# Patient Record
Sex: Female | Born: 1950
Health system: Southern US, Community
[De-identification: ages and names within clinical notes are randomized; demographics above are authoritative.]

## PROBLEM LIST (undated history)

## (undated) DIAGNOSIS — I2699 Other pulmonary embolism without acute cor pulmonale: Secondary | ICD-10-CM

## (undated) DIAGNOSIS — R12 Heartburn: Secondary | ICD-10-CM

## (undated) DIAGNOSIS — I739 Peripheral vascular disease, unspecified: Secondary | ICD-10-CM

## (undated) DIAGNOSIS — R519 Headache, unspecified: Secondary | ICD-10-CM

## (undated) DIAGNOSIS — J300 Vasomotor rhinitis: Secondary | ICD-10-CM

## (undated) DIAGNOSIS — I10 Essential (primary) hypertension: Secondary | ICD-10-CM

## (undated) DIAGNOSIS — R011 Cardiac murmur, unspecified: Secondary | ICD-10-CM

## (undated) DIAGNOSIS — M1990 Unspecified osteoarthritis, unspecified site: Secondary | ICD-10-CM

## (undated) DIAGNOSIS — R51 Headache: Secondary | ICD-10-CM

## (undated) HISTORY — PX: ABDOMINAL HYSTERECTOMY: SHX81

## (undated) HISTORY — PX: JOINT REPLACEMENT: SHX530

---

## 1984-05-09 DIAGNOSIS — I2699 Other pulmonary embolism without acute cor pulmonale: Secondary | ICD-10-CM

## 1984-05-09 HISTORY — DX: Other pulmonary embolism without acute cor pulmonale: I26.99

## 2007-08-15 ENCOUNTER — Encounter: Admission: RE | Admit: 2007-08-15 | Discharge: 2007-08-15 | Payer: Self-pay | Admitting: Family Medicine

## 2008-08-15 ENCOUNTER — Encounter: Admission: RE | Admit: 2008-08-15 | Discharge: 2008-08-15 | Payer: Self-pay | Admitting: Family Medicine

## 2009-04-09 ENCOUNTER — Inpatient Hospital Stay (HOSPITAL_COMMUNITY): Admission: RE | Admit: 2009-04-09 | Discharge: 2009-04-12 | Payer: Self-pay | Admitting: Orthopedic Surgery

## 2009-05-27 ENCOUNTER — Ambulatory Visit (HOSPITAL_COMMUNITY): Admission: RE | Admit: 2009-05-27 | Discharge: 2009-05-27 | Payer: Self-pay | Admitting: Orthopedic Surgery

## 2009-08-18 ENCOUNTER — Encounter: Admission: RE | Admit: 2009-08-18 | Discharge: 2009-08-18 | Payer: Self-pay | Admitting: Family Medicine

## 2010-08-20 ENCOUNTER — Encounter
Admission: RE | Admit: 2010-08-20 | Discharge: 2010-08-20 | Payer: Self-pay | Source: Home / Self Care | Attending: Family Medicine | Admitting: Family Medicine

## 2010-08-30 ENCOUNTER — Encounter: Payer: Self-pay | Admitting: Family Medicine

## 2010-11-13 LAB — PROTIME-INR
INR: 3.8 — ABNORMAL HIGH (ref 0.00–1.49)
Prothrombin Time: 37.3 seconds — ABNORMAL HIGH (ref 11.6–15.2)
Prothrombin Time: 39.5 seconds — ABNORMAL HIGH (ref 11.6–15.2)

## 2010-11-13 LAB — BASIC METABOLIC PANEL
BUN: 4 mg/dL — ABNORMAL LOW (ref 6–23)
BUN: 4 mg/dL — ABNORMAL LOW (ref 6–23)
CO2: 27 mEq/L (ref 19–32)
Calcium: 8.4 mg/dL (ref 8.4–10.5)
Calcium: 8.5 mg/dL (ref 8.4–10.5)
Calcium: 8.8 mg/dL (ref 8.4–10.5)
Chloride: 102 mEq/L (ref 96–112)
Chloride: 105 mEq/L (ref 96–112)
Creatinine, Ser: 0.87 mg/dL (ref 0.4–1.2)
GFR calc Af Amer: >60 mL/min (ref >60)
GFR calc Af Amer: >60 mL/min (ref >60)
GFR calc non Af Amer: >60 mL/min (ref >60)
GFR calc non Af Amer: >60 mL/min (ref >60)
Glucose, Bld: 99 mg/dL (ref 70–99)
Potassium: 3.5 mEq/L (ref 3.5–5.1)
Sodium: 138 mEq/L (ref 135–145)

## 2010-11-13 LAB — CBC
HCT: 26.6 % — ABNORMAL LOW (ref 36.0–46.0)
HCT: 28.2 % — ABNORMAL LOW (ref 36.0–46.0)
Hemoglobin: 11.2 g/dL — ABNORMAL LOW (ref 12.0–15.0)
MCV: 93.2 fL (ref 78.0–100.0)
Platelets: 140 10*3/uL — ABNORMAL LOW (ref 150–400)
Platelets: 155 10*3/uL (ref 150–400)
RDW: 12.6 % (ref 11.5–15.5)
RDW: 12.7 % (ref 11.5–15.5)
WBC: 5.1 10*3/uL (ref 4.0–10.5)

## 2010-11-13 LAB — TYPE AND SCREEN: Antibody Screen: NEGATIVE

## 2010-11-13 LAB — ABO/RH: ABO/RH(D): A POS

## 2010-11-14 LAB — PROTIME-INR
INR: 1 (ref 0.00–1.49)
Prothrombin Time: 13.2 seconds (ref 11.6–15.2)

## 2010-11-14 LAB — COMPREHENSIVE METABOLIC PANEL
ALT: 17 U/L (ref 0–35)
AST: 27 U/L (ref 0–37)
Alkaline Phosphatase: 94 U/L (ref 39–117)
Chloride: 99 mEq/L (ref 96–112)
GFR calc non Af Amer: 54 mL/min — ABNORMAL LOW (ref >60)
Glucose, Bld: 103 mg/dL — ABNORMAL HIGH (ref 70–99)
Potassium: 4.2 mEq/L (ref 3.5–5.1)

## 2010-11-14 LAB — URINALYSIS, ROUTINE W REFLEX MICROSCOPIC
Hgb urine dipstick: NEGATIVE
Nitrite: NEGATIVE
Protein, ur: NEGATIVE mg/dL
Specific Gravity, Urine: 1.011 (ref 1.005–1.030)
Urobilinogen, UA: 0.2 mg/dL (ref 0.0–1.0)

## 2010-11-14 LAB — APTT: aPTT: 34 seconds (ref 24–37)

## 2010-11-14 LAB — CBC
MCHC: 33.8 g/dL (ref 30.0–36.0)
RBC: 4.44 MIL/uL (ref 3.87–5.11)
WBC: 4.8 10*3/uL (ref 4.0–10.5)

## 2011-07-22 ENCOUNTER — Ambulatory Visit (INDEPENDENT_AMBULATORY_CARE_PROVIDER_SITE_OTHER): Payer: BC Managed Care – PPO | Admitting: Sports Medicine

## 2011-07-22 VITALS — BP 130/80 | Ht 67.0 in | Wt 178.0 lb

## 2011-07-22 DIAGNOSIS — M79609 Pain in unspecified limb: Secondary | ICD-10-CM

## 2011-07-22 DIAGNOSIS — M79672 Pain in left foot: Secondary | ICD-10-CM

## 2011-07-22 DIAGNOSIS — M2170 Unequal limb length (acquired), unspecified site: Secondary | ICD-10-CM

## 2011-07-22 NOTE — Progress Notes (Signed)
  Subjective:    Patient ID: Jennifer Russell, female    DOB: 11-Jun-1951, 60 y.o.   MRN: [id number]  HPI Jennifer Russell is a 60 years old female patient, today complaining of left foot. She was seen by Dr. Eulah Pont 2 weeks ago and diagnosed with a left foot tarsal bone arthritis. He recommended her to schedule an appointment with [place] for custom orthotic. She stated that her left foot hurts in the dorsum of her foot, 3/10 in intensity, worse with weightbearing activities, improved by rest, no radiated, no numbness no tingling. She has a high arch. She she bought new drug issues about a week ago with a rigid plastic insert  Patient Active Problem List  Diagnoses  . Foot pain, left   No current outpatient prescriptions on file prior to visit.   Allergies no known allergies     Review of Systems  Constitutional: Negative for fever, chills, diaphoresis and fatigue.  Musculoskeletal: Negative for back pain, joint swelling and arthralgias.  Neurological: Negative for weakness and numbness.       Objective:   Physical Exam  Constitutional: She appears well-developed and well-nourished.       BP 130/80  Ht 5\' 7"  (1.702 m)  Wt 178 lb (80.74 kg)  BMI 27.88 kg/m2   Pulmonary/Chest: Effort normal.  Musculoskeletal:       Feet with cavus arch. Skin is intact. No swelling no hematoma. Left foot with TTP in the dorsum of the mid foot. Full range of motion for the ankle joint and old the 5 toes Neurovascularly intact Leg length discrepancy being the right leg shorter by 2 cm. Right foot with external rotation during gait  Neurological: She is alert.  Skin: Skin is warm. No rash noted. No erythema. No pallor.          Assessment & Plan:   1. Foot pain, left   2. Leg length discrepancy

## 2011-07-22 NOTE — Patient Instructions (Signed)
Continue using orthotics F/U in 4-6 weeks for custom orthotics

## 2011-07-30 ENCOUNTER — Other Ambulatory Visit (HOSPITAL_COMMUNITY): Payer: Self-pay

## 2011-08-11 ENCOUNTER — Inpatient Hospital Stay: Admit: 2011-08-11 | Payer: Self-pay | Admitting: Orthopedic Surgery

## 2011-08-11 SURGERY — ARTHROPLASTY, KNEE, TOTAL
Anesthesia: General | Laterality: Right

## 2011-08-23 ENCOUNTER — Ambulatory Visit (INDEPENDENT_AMBULATORY_CARE_PROVIDER_SITE_OTHER): Payer: BC Managed Care – PPO | Admitting: Sports Medicine

## 2011-08-23 ENCOUNTER — Other Ambulatory Visit: Payer: Self-pay | Admitting: *Deleted

## 2011-08-23 VITALS — BP 136/81 | HR 70

## 2011-08-23 DIAGNOSIS — M79672 Pain in left foot: Secondary | ICD-10-CM

## 2011-08-23 DIAGNOSIS — M7740 Metatarsalgia, unspecified foot: Secondary | ICD-10-CM | POA: Insufficient documentation

## 2011-08-23 DIAGNOSIS — M1710 Unilateral primary osteoarthritis, unspecified knee: Secondary | ICD-10-CM | POA: Insufficient documentation

## 2011-08-23 DIAGNOSIS — M79609 Pain in unspecified limb: Secondary | ICD-10-CM

## 2011-08-23 DIAGNOSIS — M7750 Other enthesopathy of unspecified foot: Secondary | ICD-10-CM

## 2011-08-23 NOTE — Assessment & Plan Note (Signed)
See if she gets some relief of knee pain with cushion of orthotics Cont other RX as per ortho

## 2011-08-23 NOTE — Assessment & Plan Note (Addendum)
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 The orthotic was heated and afterward the patient stood on the orthotic blank positioned on the orthotic stand. The patient was positioned in subtalar neutral position and 10 degrees of ankle dorsiflexion in a weight bearing stance. After completion of molding, a stable base was applied to the orthotic blank. The blank was ground to a stable position for weight bearing. Size: 8 red cambray Base:blue EVA Posting:RT with foam lift 1/2 cm Additional orthotic padding: MT pads Time 40 mins  Gait comfortable in orthotics and well balanced foot strike

## 2011-08-23 NOTE — Progress Notes (Signed)
  Subjective:    Patient ID: Jennifer Russell, female    DOB: March 16, 1951, 60 y.o.   MRN: [id number]  HPI  Pt presents to clinic for f/u of left foot pain which she states has improved with temporary orthotics. Pain was located over navicular both anterior and lateral to posterior tib tendon.  Has used sports insoles with scaphoid and MT pads bilat.   Has had left knee replacemnt Has sig medial jt DJD of RT knee  Insoles have decreased RT knee pain and she is delaying on replacement at present     Review of Systems     Objective:   Physical Exam  Cavus foot bilat Splaying of 2-3 toes with loss of transverse arch on lt Lt foot wider than rt Rt- early splaying of 2-3 toes, 1st MTP joint shows bilat hypertrophy with subluxation on lt  Lt leg 2 cm longer than rt Excellent great toe motion bilat 2nd MT head is down on lt 2nd MT head is starting to drop on rt   Good ankle motion bilat Walking gait- does not show significant trendelenburg  Lt side of back sits slightly higher than rt with slight mid thoracic curve Slight thoracic scoliosis        Assessment & Plan:

## 2011-08-23 NOTE — Assessment & Plan Note (Signed)
Cont MT pads in all walking shoes  Reck 3 to 4 mos

## 2011-08-25 ENCOUNTER — Other Ambulatory Visit: Payer: Self-pay | Admitting: Family Medicine

## 2011-08-25 DIAGNOSIS — Z1231 Encounter for screening mammogram for malignant neoplasm of breast: Secondary | ICD-10-CM

## 2011-09-08 ENCOUNTER — Ambulatory Visit
Admission: RE | Admit: 2011-09-08 | Discharge: 2011-09-08 | Disposition: A | Discharge status: Home / Self Care | Payer: BC Managed Care – PPO | Source: Ambulatory Visit | Attending: Family Medicine | Admitting: Family Medicine

## 2011-09-08 DIAGNOSIS — Z1231 Encounter for screening mammogram for malignant neoplasm of breast: Secondary | ICD-10-CM

## 2012-08-22 ENCOUNTER — Other Ambulatory Visit: Payer: Self-pay | Admitting: Family Medicine

## 2012-08-22 DIAGNOSIS — Z1231 Encounter for screening mammogram for malignant neoplasm of breast: Secondary | ICD-10-CM

## 2012-09-25 ENCOUNTER — Ambulatory Visit
Admission: RE | Admit: 2012-09-25 | Discharge: 2012-09-25 | Disposition: A | Discharge status: Home / Self Care | Payer: BC Managed Care – PPO | Source: Ambulatory Visit | Attending: Family Medicine | Admitting: Family Medicine

## 2012-09-25 DIAGNOSIS — Z1231 Encounter for screening mammogram for malignant neoplasm of breast: Secondary | ICD-10-CM

## 2013-01-11 ENCOUNTER — Other Ambulatory Visit: Payer: Self-pay | Admitting: Neurosurgery

## 2013-01-11 DIAGNOSIS — M4802 Spinal stenosis, cervical region: Secondary | ICD-10-CM

## 2013-01-17 ENCOUNTER — Ambulatory Visit
Admission: RE | Admit: 2013-01-17 | Discharge: 2013-01-17 | Disposition: A | Discharge status: Home / Self Care | Payer: BC Managed Care – PPO | Source: Ambulatory Visit | Attending: Neurosurgery | Admitting: Neurosurgery

## 2013-01-17 VITALS — BP 120/75 | HR 52

## 2013-01-17 DIAGNOSIS — M4802 Spinal stenosis, cervical region: Secondary | ICD-10-CM

## 2013-01-17 MED ORDER — MORPHINE SULFATE 4 MG/ML IJ SOLN
5.0000 mg | Freq: Once | INTRAMUSCULAR | Status: AC
  Administered 2013-01-17: 5 mg via INTRAMUSCULAR

## 2013-01-17 MED ORDER — DIAZEPAM 5 MG PO TABS
10.0000 mg | ORAL_TABLET | Freq: Once | ORAL | Status: AC
  Administered 2013-01-17: 10 mg via ORAL

## 2013-01-17 MED ORDER — ONDANSETRON HCL 4 MG/2ML IJ SOLN
4.0000 mg | Freq: Once | INTRAMUSCULAR | Status: AC
  Administered 2013-01-17: 4 mg via INTRAMUSCULAR

## 2013-01-17 MED ORDER — IOHEXOL 300 MG/ML  SOLN
10.0000 mL | Freq: Once | INTRAMUSCULAR | Status: AC | PRN
  Administered 2013-01-17: 10 mL via INTRATHECAL

## 2013-01-17 NOTE — Progress Notes (Signed)
Pt states she has been off her cymbalta for the past 2 days. Discharge instructions explained.

## 2013-09-05 ENCOUNTER — Other Ambulatory Visit: Payer: Self-pay

## 2013-09-05 DIAGNOSIS — Z1231 Encounter for screening mammogram for malignant neoplasm of breast: Secondary | ICD-10-CM

## 2013-09-26 ENCOUNTER — Ambulatory Visit
Admission: RE | Admit: 2013-09-26 | Discharge: 2013-09-26 | Disposition: A | Discharge status: Home / Self Care | Payer: BC Managed Care – PPO | Source: Ambulatory Visit

## 2013-09-26 DIAGNOSIS — Z1231 Encounter for screening mammogram for malignant neoplasm of breast: Secondary | ICD-10-CM

## 2014-08-28 ENCOUNTER — Other Ambulatory Visit: Payer: Self-pay

## 2014-08-28 DIAGNOSIS — Z1231 Encounter for screening mammogram for malignant neoplasm of breast: Secondary | ICD-10-CM

## 2014-10-02 ENCOUNTER — Ambulatory Visit: Payer: BC Managed Care – PPO

## 2014-10-10 ENCOUNTER — Ambulatory Visit
Admission: RE | Admit: 2014-10-10 | Discharge: 2014-10-10 | Disposition: A | Discharge status: Home / Self Care | Payer: BLUE CROSS/BLUE SHIELD | Source: Ambulatory Visit

## 2014-10-10 DIAGNOSIS — Z1231 Encounter for screening mammogram for malignant neoplasm of breast: Secondary | ICD-10-CM

## 2015-09-10 ENCOUNTER — Other Ambulatory Visit: Payer: Self-pay

## 2015-09-10 DIAGNOSIS — Z1231 Encounter for screening mammogram for malignant neoplasm of breast: Secondary | ICD-10-CM

## 2015-10-13 ENCOUNTER — Ambulatory Visit
Admission: RE | Admit: 2015-10-13 | Discharge: 2015-10-13 | Disposition: A | Discharge status: Home / Self Care | Payer: BLUE CROSS/BLUE SHIELD | Source: Ambulatory Visit

## 2015-10-13 DIAGNOSIS — Z1231 Encounter for screening mammogram for malignant neoplasm of breast: Secondary | ICD-10-CM

## 2016-05-11 ENCOUNTER — Encounter: Payer: Self-pay | Admitting: Family Medicine

## 2016-05-11 ENCOUNTER — Ambulatory Visit (INDEPENDENT_AMBULATORY_CARE_PROVIDER_SITE_OTHER): Payer: BLUE CROSS/BLUE SHIELD | Admitting: Family Medicine

## 2016-05-11 DIAGNOSIS — M79672 Pain in left foot: Secondary | ICD-10-CM

## 2016-05-11 NOTE — Progress Notes (Signed)
  Genella MechMeg Korson - 65 y.o. female MRN [id number]  Date of birth: 1950/11/21  SUBJECTIVE:  Including CC & ROS.  Chief Complaint  Patient presents with  . Foot Pain     Ms.Harriette BouillonStarnes is a 65 year old female that is presenting with left foot pain. This pain is acute on chronic in nature. The pain is occurring on the dorsal aspect of her left foot. She has [place] [facility] with some improvement. She has a history of orthotics that were made here and are about 63 years old. She has a history of midfoot arthritis. She denies any recent injury. Her foot does tend to swell from time to time.  ROS: No unexpected weight loss, fever, chills, instability, muscle pain, numbness/tingling, redness, otherwise see HPI   HISTORY: Past Medical, Surgical, Social, and Family History Reviewed & Updated per EMR.   Pertinent Historical Findings include: PMSHx -  History of PE, left knee TKA, plantar fascial release, leg length discrepancy PSHx -  No tobacco or alcohol use  FHx -  No osteoporosis  Medications - atenolol   DATA REVIEWED: [facility] been [facility] but she does not have them today.  PHYSICAL EXAM:  VS: BP:135/81  HR:62bpm  TEMP: ( )  RESP:   HT:5\' 7"  (170.2 cm)   WT:173 lb (78.5 kg)  BMI:27.2 PHYSICAL EXAM: Gen: NAD, alert, cooperative with exam, well-appearing HEENT: clear conjunctiva, EOMI CV:  capillary refill brisk,  Resp: non-labored, normal speech Skin: no rashes, normal turgor  Neuro: no gross deficits.  Psych:  alert and oriented Left Foot:  Some tarsal metatarsal bossing of the dorsal midfoot. Tenderness to palpation over the tarsometatarsal joints. Mild effusion and the midfoot area. Normal ankle range of motion. First MTP joint with some hypertrophy. Cavus foot bilaterally   ASSESSMENT & PLAN:   Foot pain, left She has a history of midfoot DJD and most likely her pain is associated with this. - Right midfoot strap -  Encouraged to follow-up for new orthotics. She has a history of leg length discrepancies orthotic may need additional padding - Continue to ice and take anti-inflammatories as needed.

## 2016-05-11 NOTE — Assessment & Plan Note (Signed)
She has a history of midfoot DJD and most likely her pain is associated with this. - Right midfoot strap - Encouraged to follow-up for new orthotics. She has a history of leg length discrepancies orthotic may need additional padding - Continue to ice and take anti-inflammatories as needed.

## 2016-05-18 ENCOUNTER — Encounter: Payer: BLUE CROSS/BLUE SHIELD | Admitting: Family Medicine

## 2016-07-14 NOTE — Progress Notes (Signed)
Scheduling pre op--please place SURGICAL ORDERS IN EPIC  thanks 

## 2016-07-15 ENCOUNTER — Ambulatory Visit: Payer: Self-pay | Admitting: Orthopedic Surgery

## 2016-07-15 NOTE — Progress Notes (Signed)
Please place SURGICAL ORDERS IN EPIC--HAS PRE OP SCHEDULED  thanks

## 2016-07-15 NOTE — H&P (Signed)
Jennifer ItoMeg O Busey DOB: 1951/03/12 Married / Language: English / Race: White Female Date of Admission:  07/26/2016 CC:  Right Knee Pain History of Present Illness The patient is a 65 year old female who comes in for a preoperative History and Physical. The patient is scheduled for a right total knee arthroplasty to be performed by Dr. Gus RankinFrank V. Aluisio, [facility] on 07-26-2016. The patient is a 65 year old female who presented for follow up of their knee. The patient is being followed for their right knee pain and osteoarthritis. They are now month(s) out from cortisone injection (that did provide some relief). Symptoms reported include: pain (mainly with weightbearing) and stiffness. The following medication has been used for pain control: none. The knee is unfortunately getting progressively worse over time. She is not getting swelling. It is not giving out. She does have significant pain medially. It is limiting what she can and cannot do. She wants to be more active, but cannot do so because of the knee. It does hurt at night at times. She is at a stage where she feels like she needs to get something done because of the significant pain and dysfunction in the knee. Radiographs show bone on bone arthritis in the medial and patellofemoral compartments of that right knee. She has significant pain and dysfunction. At this point, the most predictable means of improving her pain and function would be total knee arthroplasty. She has had the other side done so she is familiar with the this. Discussed with her the differences now compared to when she had the other side done. We discussed those in detail. She would like to proceed with right total knee arthroplasty.  They have been treated conservatively in the past for the above stated problem and despite conservative measures, they continue to have progressive pain and severe functional limitations and dysfunction. They have failed  non-operative management including home exercise, medications, and injections. It is felt that they would benefit from undergoing total joint replacement. Risks and benefits of the procedure have been discussed with the patient and they elect to proceed with surgery. There are no active contraindications to surgery such as ongoing infection or rapidly progressive neurological disease.  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 sister Cancer  First Degree Relatives. brother  Social History  Tobacco use  Never smoker. never smoker Tobacco / smoke exposure  no Pain Contract  yes Illicit drug use  no Exercise  Exercises daily; does other and gym / weights Drug/Alcohol Rehab (Previously)  no Number of flights of stairs before winded  greater than 5 Marital status  married Living situation  live with spouse Alcohol use  current drinker; drinks wine; 5-7 per week Drug/Alcohol Rehab (Currently)  no Children  2  Medication  History  Relpax (20MG  Tablet, Oral) Active. (prn migranes) Vitamin D (1 Oral) Specific strength unknown - Active. (qd) Atenolol (100MG  Tablet, Oral) Active. (qd)  Past  Surgical History Dilation and Curettage of Uterus - Multiple  Arthroscopy of Knee  left Hysterectomy  partial (non-cancerous) Foot Surgery  bilateral Tubal Ligation  Total Knee Replacement  left Sinus Surgery    Review of Systems General Not Present- Chills, Fatigue, Fever, Memory Loss, Night Sweats, Weight Gain and Weight Loss. Skin Not Present- Eczema, Hives, Itching, Lesions and Rash. HEENT Not Present- Dentures, Double Vision, Headache, Hearing Loss, Tinnitus and Visual Loss. Respiratory Not Present- Allergies, Chronic Cough, Coughing up blood, Shortness of breath at rest and Shortness of breath with exertion. Cardiovascular Not Present- Chest Pain, Difficulty Breathing Lying Down, Murmur, Palpitations, Racing/skipping heartbeats and Swelling. Gastrointestinal Not Present- Abdominal Pain, Bloody Stool, Constipation, Diarrhea, Difficulty Swallowing, Heartburn, Jaundice, Loss of appetitie, Nausea and Vomiting. Female Genitourinary Not Present- Blood in Urine, Discharge, Flank Pain, Incontinence, Painful Urination, Urgency, Urinary frequency, Urinary Retention, Urinating at Night and Weak urinary stream. Musculoskeletal Present- Joint Swelling. Not Present- Back Pain, Joint Pain, Morning Stiffness, Muscle Pain, Muscle Weakness and Spasms. Neurological Not Present- Blackout spells, Difficulty with balance, Dizziness, Paralysis, Tremor and Weakness. Psychiatric Not Present- Insomnia.  Vitals  Weight: 174 lb Height: 66.5in Weight was reported by patient. Height was reported by patient. Body Surface Area: 1.9 m Body Mass Index: 27.66 kg/m  Pulse: 62 (Regular)  BP: 142/82 (Sitting, Right Arm, Standard)   Physical Exam General Mental Status -Alert, cooperative and good historian. General Appearance-pleasant, Not in acute distress. Orientation-Oriented X3. Build & Nutrition-Well nourished and Well developed.  Head and Neck Head-normocephalic, atraumatic  . Neck Global Assessment - supple, no bruit auscultated on the right, no bruit auscultated on the left.  Eye Vision-Wears corrective lenses. Pupil - Bilateral-Regular and Round. Motion - Bilateral-EOMI.  Chest and Lung Exam Auscultation Breath sounds - clear at anterior chest wall and clear at posterior chest wall. Adventitious sounds - No Adventitious sounds.  Cardiovascular Auscultation Rhythm - Regular rate and rhythm. Heart Sounds - S1 WNL and S2 WNL. Murmurs & Other Heart Sounds - Auscultation of the heart reveals - No Murmurs.  Abdomen Palpation/Percussion Tenderness - Abdomen is non-tender to palpation. Rigidity (guarding) - Abdomen is soft. Auscultation Auscultation of the abdomen reveals - Bowel sounds normal.  Female Genitourinary Note: Not done, not pertinent to present illness   Musculoskeletal Note: A well-developed female, alert, oriented, in no apparent distress. Evaluation of her right knee shows no effusion. Range about 5 to 130. There is moderate crepitus on range of motion with some tenderness medial greater than lateral. No instability noted. Pulse, sensation, motor intact. She has got significant antalgic gait pattern.  RADIOGRAPHS Do show bone on bone arthritis in the medial and patellofemoral compartments of that right knee.  Assessment & Plan Primary osteoarthritis of right knee (M17.11)  Note:Surgical Plans: Right Total Knee Replacement  Disposition: Home  PCP: Dr. Laurann Montanaynthia White  Topical TXA - History of Pulmonary Embolism  Anesthesia Issues: None  Signed electronically by Beckey RutterAlezandrew L Evamaria Detore, III PA-C

## 2016-07-19 NOTE — Patient Instructions (Addendum)
Jennifer Russell  07/19/2016   Your procedure is scheduled on: 07/26/16  [facility]  [facility] to 3rd floor to  Short Stay Center at 0500 AM.  Call this number if you have problems the morning of surgery [phone]   Remember: ONLY 1 PERSON MAY GO WITH YOU TO SHORT STAY TO GET  READY MORNING OF YOUR SURGERY.  Do not eat food or drink liquids :After Midnight.     Take these medicines the morning of surgery with A SIP OF WATER:ATENOLOL          Use Atrovent and Flonase May take Valium if needed                                 You may not have any metal on your body including hair pins and              piercings  Do not wear jewelry, make-up, lotions, powders or perfumes, deodorant             Do not wear nail polish.  Do not shave  48 hours prior to surgery.              Men may shave face and neck.   Do not bring valuables to the hospital. [facility] IS NOT             RESPONSIBLE   FOR VALUABLES.  Contacts, dentures or bridgework may not be worn into surgery.  Leave suitcase in the car. After surgery it may be brought to your room.                Please read over the following fact sheets you were given: _____________________________________________________________________             [facility] - Preparing for Surgery Before surgery, you can play an important role.  Because skin is not sterile, your skin needs to be as free of germs as possible.  You can reduce the number of germs on your skin by washing with CHG (chlorahexidine gluconate) soap before surgery.  CHG is an antiseptic cleaner which kills germs and bonds with the skin to continue killing germs even after washing. Please DO NOT use if you have an allergy to CHG or antibacterial soaps.  If your skin becomes reddened/irritated stop using the CHG and inform your nurse when you arrive at Short Stay. Do not shave (including legs and underarms) for at least 48  hours prior to the first CHG shower.  You may shave your face/neck. Please follow these instructions carefully:  1.  Shower with CHG Soap the night before surgery and the  morning of Surgery.  2.  If you choose to wash your hair, wash your hair first as usual with your  normal  shampoo.  3.  After you shampoo, rinse your hair and body thoroughly to remove the  shampoo.                           4.  Use CHG as you would any other liquid soap.  You can apply chg directly  to the skin and wash  Gently with a scrungie or clean washcloth.  5.  Apply the CHG Soap to your body ONLY FROM THE NECK DOWN.   Do not use on face/ open                           Wound or open sores. Avoid contact with eyes, ears mouth and genitals (private parts).                       Wash face,  Genitals (private parts) with your normal soap.             6.  Wash thoroughly, paying special attention to the area where your surgery  will be performed.  7.  Thoroughly rinse your body with warm water from the neck down.  8.  DO NOT shower/wash with your normal soap after using and rinsing off  the CHG Soap.                9.  Pat yourself dry with a clean towel.            10.  Wear clean pajamas.            11.  Place clean sheets on your bed the night of your first shower and do not  sleep with pets. Day of Surgery : Do not apply any lotions/deodorants the morning of surgery.  Please wear clean clothes to the hospital/surgery center.  FAILURE TO FOLLOW THESE INSTRUCTIONS MAY RESULT IN THE CANCELLATION OF YOUR SURGERY PATIENT SIGNATURE_________________________________  NURSE SIGNATURE__________________________________  ________________________________________________________________________   Adam Phenix  An incentive spirometer is a tool that can help keep your lungs clear and active. This tool measures how well you are filling your lungs with each breath. Taking long deep breaths may help  reverse or decrease the chance of developing breathing (pulmonary) problems (especially infection) following:  A long period of time when you are unable to move or be active. BEFORE THE PROCEDURE   If the spirometer includes an indicator to show your best effort, your nurse or respiratory therapist will set it to a desired goal.  If possible, sit up straight or lean slightly forward. Try not to slouch.  Hold the incentive spirometer in an upright position. INSTRUCTIONS FOR USE  1. Sit on the edge of your bed if possible, or sit up as far as you can in bed or on a chair. 2. Hold the incentive spirometer in an upright position. 3. Breathe out normally. 4. Place the mouthpiece in your mouth and seal your lips tightly around it. 5. Breathe in slowly and as deeply as possible, raising the piston or the ball toward the top of the column. 6. Hold your breath for 3-5 seconds or for as long as possible. Allow the piston or ball to fall to the bottom of the column. 7. Remove the mouthpiece from your mouth and breathe out normally. 8. Rest for a few seconds and repeat Steps 1 through 7 at least 10 times every 1-2 hours when you are awake. Take your time and take a few normal breaths between deep breaths. 9. The spirometer may include an indicator to show your best effort. Use the indicator as a goal to work toward during each repetition. 10. After each set of 10 deep breaths, practice coughing to be sure your lungs are clear. If you have an incision (the cut made at the time of surgery),  support your incision when coughing by placing a pillow or rolled up towels firmly against it. Once you are able to get out of bed, walk around indoors and cough well. You may stop using the incentive spirometer when instructed by your caregiver.  RISKS AND COMPLICATIONS  Take your time so you do not get dizzy or light-headed.  If you are in pain, you may need to take or ask for pain medication before doing incentive  spirometry. It is harder to take a deep breath if you are having pain. AFTER USE  Rest and breathe slowly and easily.  It can be helpful to keep track of a log of your progress. Your caregiver can provide you with a simple table to help with this. If you are using the spirometer at home, follow these instructions: [place] IF:   You are having difficultly using the spirometer.  You have trouble using the spirometer as often as instructed.  Your pain medication is not giving enough relief while using the spirometer.  You develop fever of 100.5 F (38.1 C) or higher. SEEK IMMEDIATE MEDICAL CARE IF:   You cough up bloody sputum that had not been present before.  You develop fever of 102 F (38.9 C) or greater.  You develop worsening pain at or near the incision site. MAKE SURE YOU:   Understand these instructions.  Will watch your condition.  Will get help right away if you are not doing well or get worse. Document Released: 12/06/2006 Document Revised: 10/18/2011 Document Reviewed: 02/06/2007 ExitCare Patient Information 2014 Exit[employer], [place].   ________________________________________________________________________  WHAT IS A BLOOD TRANSFUSION? Blood Transfusion Information  A transfusion is the replacement of blood or some of its parts. Blood is made up of multiple cells which provide different functions.  Red blood cells carry oxygen and are used for blood loss replacement.  White blood cells fight against infection.  Platelets control bleeding.  Plasma helps clot blood.  Other blood products are available for specialized needs, such as hemophilia or other clotting disorders. BEFORE THE TRANSFUSION  Who gives blood for transfusions?   Healthy volunteers who are fully evaluated to make sure their blood is safe. This is blood bank blood. Transfusion therapy is the safest it has ever been in the practice of medicine. Before blood is taken from a donor, a  complete history is taken to make sure that person has no history of diseases nor engages in risky social behavior (examples are intravenous drug use or sexual activity with multiple partners). The donor's travel history is screened to minimize risk of transmitting infections, such as malaria. The donated blood is tested for signs of infectious diseases, such as HIV and hepatitis. The blood is then tested to be sure it is compatible with you in order to minimize the chance of a transfusion reaction. If you or a relative donates blood, this is often done in anticipation of surgery and is not appropriate for emergency situations. It takes many days to process the donated blood. RISKS AND COMPLICATIONS Although transfusion therapy is very safe and saves many lives, the main dangers of transfusion include:   Getting an infectious disease.  Developing a transfusion reaction. This is an allergic reaction to something in the blood you were given. Every precaution is taken to prevent this. The decision to have a blood transfusion has been considered carefully by your caregiver before blood is given. Blood is not given unless the benefits outweigh the risks. AFTER THE TRANSFUSION  Right after receiving a blood transfusion, you will usually feel much better and more energetic. This is especially true if your red blood cells have gotten low (anemic). The transfusion raises the level of the red blood cells which carry oxygen, and this usually causes an energy increase.  The nurse administering the transfusion will monitor you carefully for complications. HOME CARE INSTRUCTIONS  No special instructions are needed after a transfusion. You may find your energy is better. Speak with your caregiver about any limitations on activity for underlying diseases you may have. SEEK MEDICAL CARE IF:   Your condition is not improving after your transfusion.  You develop redness or irritation at the intravenous (IV)  site. SEEK IMMEDIATE MEDICAL CARE IF:  Any of the following symptoms occur over the next 12 hours:  Shaking chills.  You have a temperature by mouth above 102 F (38.9 C), not controlled by medicine.  Chest, back, or muscle pain.  People around you feel you are not acting correctly or are confused.  Shortness of breath or difficulty breathing.  Dizziness and fainting.  You get a rash or develop hives.  You have a decrease in urine output.  Your urine turns a dark color or changes to pink, red, or brown. Any of the following symptoms occur over the next 10 days:  You have a temperature by mouth above 102 F (38.9 C), not controlled by medicine.  Shortness of breath.  Weakness after normal activity.  The white part of the eye turns yellow (jaundice).  You have a decrease in the amount of urine or are urinating less often.  Your urine turns a dark color or changes to pink, red, or brown. Document Released: 07/23/2000 Document Revised: 10/18/2011 Document Reviewed: 03/11/2008 [facility] Patient Information 2014 [place], [place].  _______________________________________________________________________

## 2016-07-20 ENCOUNTER — Encounter (HOSPITAL_COMMUNITY)
Admission: RE | Admit: 2016-07-20 | Discharge: 2016-07-20 | Disposition: A | Discharge status: Home / Self Care | Payer: BLUE CROSS/BLUE SHIELD | Source: Ambulatory Visit | Attending: Orthopedic Surgery | Admitting: Orthopedic Surgery

## 2016-07-20 ENCOUNTER — Encounter (HOSPITAL_COMMUNITY): Payer: Self-pay

## 2016-07-20 ENCOUNTER — Encounter (INDEPENDENT_AMBULATORY_CARE_PROVIDER_SITE_OTHER): Payer: Self-pay

## 2016-07-20 DIAGNOSIS — Z01812 Encounter for preprocedural laboratory examination: Secondary | ICD-10-CM | POA: Insufficient documentation

## 2016-07-20 DIAGNOSIS — M1711 Unilateral primary osteoarthritis, right knee: Secondary | ICD-10-CM | POA: Insufficient documentation

## 2016-07-20 HISTORY — DX: Unspecified osteoarthritis, unspecified site: M19.90

## 2016-07-20 HISTORY — DX: Other pulmonary embolism without acute cor pulmonale: I26.99

## 2016-07-20 HISTORY — DX: Vasomotor rhinitis: J30.0

## 2016-07-20 HISTORY — DX: Heartburn: R12

## 2016-07-20 HISTORY — DX: Headache, unspecified: R51.9

## 2016-07-20 HISTORY — DX: Essential (primary) hypertension: I10

## 2016-07-20 HISTORY — DX: Peripheral vascular disease, unspecified: I73.9

## 2016-07-20 HISTORY — DX: Cardiac murmur, unspecified: R01.1

## 2016-07-20 HISTORY — DX: Headache: R51

## 2016-07-20 LAB — COMPREHENSIVE METABOLIC PANEL
ALBUMIN: 4.4 g/dL (ref 3.5–5.0)
ALK PHOS: 69 U/L (ref 38–126)
ALT: 29 U/L (ref 14–54)
ANION GAP: 7 (ref 5–15)
AST: 26 U/L (ref 15–41)
BILIRUBIN TOTAL: 1.2 mg/dL (ref 0.3–1.2)
BUN: 25 mg/dL — ABNORMAL HIGH (ref 6–20)
CO2: 26 mmol/L (ref 22–32)
Calcium: 9.2 mg/dL (ref 8.9–10.3)
Chloride: 106 mmol/L (ref 101–111)
Creatinine, Ser: 0.87 mg/dL (ref 0.44–1.00)
GLUCOSE: 92 mg/dL (ref 65–99)
Potassium: 4.2 mmol/L (ref 3.5–5.1)
Sodium: 139 mmol/L (ref 135–145)
TOTAL PROTEIN: 6.7 g/dL (ref 6.5–8.1)

## 2016-07-20 LAB — URINALYSIS, ROUTINE W REFLEX MICROSCOPIC
Bilirubin Urine: NEGATIVE
Glucose, UA: NEGATIVE mg/dL
Hgb urine dipstick: NEGATIVE
KETONES UR: NEGATIVE mg/dL
LEUKOCYTES UA: NEGATIVE
NITRITE: NEGATIVE
PH: 6 (ref 5.0–8.0)
PROTEIN: NEGATIVE mg/dL
Specific Gravity, Urine: 1.015 (ref 1.005–1.030)

## 2016-07-20 LAB — ABO/RH: ABO/RH(D): A POS

## 2016-07-20 LAB — CBC
HCT: 43.3 % (ref 36.0–46.0)
HEMOGLOBIN: 14.4 g/dL (ref 12.0–15.0)
MCH: 30.8 pg (ref 26.0–34.0)
MCHC: 33.3 g/dL (ref 30.0–36.0)
MCV: 92.5 fL (ref 78.0–100.0)
Platelets: 180 10*3/uL (ref 150–400)
RBC: 4.68 MIL/uL (ref 3.87–5.11)
RDW: 13.2 % (ref 11.5–15.5)
WBC: 5.1 10*3/uL (ref 4.0–10.5)

## 2016-07-20 LAB — SURGICAL PCR SCREEN
MRSA, PCR: NEGATIVE
Staphylococcus aureus: NEGATIVE

## 2016-07-20 LAB — PROTIME-INR
INR: 1.01
PROTHROMBIN TIME: 13.3 s (ref 11.4–15.2)

## 2016-07-20 LAB — APTT: aPTT: 27 seconds (ref 24–36)

## 2016-07-20 NOTE — Progress Notes (Signed)
Clearance note  With LOV Dr Cliffton AstersWhite and ekg  07/16/16 on chart

## 2016-07-26 ENCOUNTER — Encounter (HOSPITAL_COMMUNITY): Payer: Self-pay | Admitting: *Deleted

## 2016-07-26 ENCOUNTER — Inpatient Hospital Stay (HOSPITAL_COMMUNITY)
Admission: RE | Admit: 2016-07-26 | Discharge: 2016-07-28 | DRG: 470 | Disposition: A | Discharge status: Home / Self Care | Payer: BLUE CROSS/BLUE SHIELD | Source: Ambulatory Visit | Attending: Orthopedic Surgery | Admitting: Orthopedic Surgery

## 2016-07-26 ENCOUNTER — Encounter (HOSPITAL_COMMUNITY)
Admission: RE | Disposition: A | Discharge status: Home / Self Care | Payer: Self-pay | Source: Ambulatory Visit | Attending: Orthopedic Surgery

## 2016-07-26 ENCOUNTER — Inpatient Hospital Stay (HOSPITAL_COMMUNITY): Payer: BLUE CROSS/BLUE SHIELD | Admitting: Anesthesiology

## 2016-07-26 DIAGNOSIS — Z96652 Presence of left artificial knee joint: Secondary | ICD-10-CM | POA: Diagnosis present

## 2016-07-26 DIAGNOSIS — Z90711 Acquired absence of uterus with remaining cervical stump: Secondary | ICD-10-CM

## 2016-07-26 DIAGNOSIS — Z809 Family history of malignant neoplasm, unspecified: Secondary | ICD-10-CM | POA: Diagnosis not present

## 2016-07-26 DIAGNOSIS — Z8249 Family history of ischemic heart disease and other diseases of the circulatory system: Secondary | ICD-10-CM | POA: Diagnosis not present

## 2016-07-26 DIAGNOSIS — M1711 Unilateral primary osteoarthritis, right knee: Principal | ICD-10-CM | POA: Diagnosis present

## 2016-07-26 DIAGNOSIS — M1710 Unilateral primary osteoarthritis, unspecified knee: Secondary | ICD-10-CM

## 2016-07-26 DIAGNOSIS — Z9851 Tubal ligation status: Secondary | ICD-10-CM

## 2016-07-26 DIAGNOSIS — Z86711 Personal history of pulmonary embolism: Secondary | ICD-10-CM | POA: Diagnosis not present

## 2016-07-26 DIAGNOSIS — I739 Peripheral vascular disease, unspecified: Secondary | ICD-10-CM | POA: Diagnosis present

## 2016-07-26 DIAGNOSIS — I10 Essential (primary) hypertension: Secondary | ICD-10-CM | POA: Diagnosis present

## 2016-07-26 DIAGNOSIS — M179 Osteoarthritis of knee, unspecified: Secondary | ICD-10-CM | POA: Diagnosis present

## 2016-07-26 HISTORY — PX: TOTAL KNEE ARTHROPLASTY: SHX125

## 2016-07-26 LAB — TYPE AND SCREEN
ABO/RH(D): A POS
Antibody Screen: NEGATIVE

## 2016-07-26 SURGERY — ARTHROPLASTY, KNEE, TOTAL
Anesthesia: Spinal | Site: Knee | Laterality: Right

## 2016-07-26 MED ORDER — FLEET ENEMA 7-19 GM/118ML RE ENEM: 1.0000 | ENEMA | Freq: Once | RECTAL | Status: DC | PRN

## 2016-07-26 MED ORDER — CEFAZOLIN SODIUM-DEXTROSE 2-4 GM/100ML-% IV SOLN
INTRAVENOUS | Status: AC
  Filled 2016-07-26: qty 100

## 2016-07-26 MED ORDER — ZOLPIDEM TARTRATE 5 MG PO TABS
5.0000 mg | ORAL_TABLET | Freq: Every evening | ORAL | Status: DC | PRN
  Administered 2016-07-26 – 2016-07-28 (×2): 5 mg via ORAL
  Filled 2016-07-26 (×2): qty 1

## 2016-07-26 MED ORDER — EPHEDRINE SULFATE 50 MG/ML IJ SOLN
INTRAMUSCULAR | Status: DC | PRN
  Administered 2016-07-26 (×2): 5 mg via INTRAVENOUS

## 2016-07-26 MED ORDER — HYDROMORPHONE HCL 2 MG/ML IJ SOLN
INTRAMUSCULAR | Status: AC
  Filled 2016-07-26: qty 1

## 2016-07-26 MED ORDER — DIAZEPAM 5 MG PO TABS
5.0000 mg | ORAL_TABLET | Freq: Every day | ORAL | Status: DC | PRN
  Administered 2016-07-27: 5 mg via ORAL
  Filled 2016-07-26: qty 1

## 2016-07-26 MED ORDER — BUPIVACAINE LIPOSOME 1.3 % IJ SUSP
INTRAMUSCULAR | Status: DC | PRN
  Administered 2016-07-26: 20 mL

## 2016-07-26 MED ORDER — MIDAZOLAM HCL 2 MG/2ML IJ SOLN
INTRAMUSCULAR | Status: AC
  Filled 2016-07-26: qty 2

## 2016-07-26 MED ORDER — FENTANYL CITRATE (PF) 100 MCG/2ML IJ SOLN
INTRAMUSCULAR | Status: DC | PRN
  Administered 2016-07-26 (×2): 50 ug via INTRAVENOUS

## 2016-07-26 MED ORDER — FLUTICASONE PROPIONATE 50 MCG/ACT NA SUSP
2.0000 | Freq: Every day | NASAL | Status: DC
  Filled 2016-07-26: qty 16

## 2016-07-26 MED ORDER — METHOCARBAMOL 1000 MG/10ML IJ SOLN
500.0000 mg | Freq: Four times a day (QID) | INTRAVENOUS | Status: DC | PRN
  Administered 2016-07-26: 500 mg via INTRAVENOUS
  Filled 2016-07-26: qty 550
  Filled 2016-07-26: qty 5

## 2016-07-26 MED ORDER — PHENOL 1.4 % MT LIQD
1.0000 | OROMUCOSAL | Status: DC | PRN
  Filled 2016-07-26: qty 177

## 2016-07-26 MED ORDER — TRANEXAMIC ACID 1000 MG/10ML IV SOLN
INTRAVENOUS | Status: DC | PRN
  Administered 2016-07-26: 2000 mg via TOPICAL

## 2016-07-26 MED ORDER — DOCUSATE SODIUM 100 MG PO CAPS
100.0000 mg | ORAL_CAPSULE | Freq: Two times a day (BID) | ORAL | Status: DC
  Administered 2016-07-26 – 2016-07-28 (×4): 100 mg via ORAL
  Filled 2016-07-26 (×4): qty 1

## 2016-07-26 MED ORDER — PROPOFOL 10 MG/ML IV BOLUS
INTRAVENOUS | Status: DC | PRN
  Administered 2016-07-26: 40 mg via INTRAVENOUS

## 2016-07-26 MED ORDER — CEFAZOLIN SODIUM-DEXTROSE 2-4 GM/100ML-% IV SOLN
2.0000 g | INTRAVENOUS | Status: AC
  Administered 2016-07-26: 2 g via INTRAVENOUS

## 2016-07-26 MED ORDER — SODIUM CHLORIDE 0.9 % IV SOLN
INTRAVENOUS | Status: DC
  Administered 2016-07-26 – 2016-07-27 (×2): via INTRAVENOUS

## 2016-07-26 MED ORDER — TRANEXAMIC ACID 1000 MG/10ML IV SOLN
2000.0000 mg | Freq: Once | INTRAVENOUS | Status: DC
  Filled 2016-07-26: qty 20

## 2016-07-26 MED ORDER — CHLORHEXIDINE GLUCONATE 4 % EX LIQD: 60.0000 mL | Freq: Once | CUTANEOUS | Status: DC

## 2016-07-26 MED ORDER — ACETAMINOPHEN 500 MG PO TABS
1000.0000 mg | ORAL_TABLET | Freq: Four times a day (QID) | ORAL | Status: AC
  Administered 2016-07-26 – 2016-07-27 (×4): 1000 mg via ORAL
  Filled 2016-07-26 (×4): qty 2

## 2016-07-26 MED ORDER — DEXAMETHASONE SODIUM PHOSPHATE 10 MG/ML IJ SOLN
INTRAMUSCULAR | Status: AC
  Filled 2016-07-26: qty 1

## 2016-07-26 MED ORDER — ACETAMINOPHEN 650 MG RE SUPP: 650.0000 mg | Freq: Four times a day (QID) | RECTAL | Status: DC | PRN

## 2016-07-26 MED ORDER — DIPHENHYDRAMINE HCL 12.5 MG/5ML PO ELIX: 12.5000 mg | ORAL_SOLUTION | ORAL | Status: DC | PRN

## 2016-07-26 MED ORDER — IPRATROPIUM BROMIDE 0.06 % NA SOLN
2.0000 | Freq: Every day | NASAL | Status: DC
Start: 2016-07-27 — End: 2016-07-28
  Filled 2016-07-26: qty 15

## 2016-07-26 MED ORDER — CEFAZOLIN SODIUM-DEXTROSE 2-4 GM/100ML-% IV SOLN
2.0000 g | Freq: Four times a day (QID) | INTRAVENOUS | Status: AC
  Administered 2016-07-26 (×2): 2 g via INTRAVENOUS
  Filled 2016-07-26 (×2): qty 100

## 2016-07-26 MED ORDER — MORPHINE SULFATE (PF) 2 MG/ML IV SOLN
1.0000 mg | INTRAVENOUS | Status: DC | PRN
  Administered 2016-07-26: 11:00:00 1 mg via INTRAVENOUS
  Filled 2016-07-26: qty 1

## 2016-07-26 MED ORDER — ACETAMINOPHEN 10 MG/ML IV SOLN
INTRAVENOUS | Status: AC
  Filled 2016-07-26: qty 100

## 2016-07-26 MED ORDER — ELETRIPTAN HYDROBROMIDE 40 MG PO TABS
40.0000 mg | ORAL_TABLET | ORAL | Status: DC | PRN
  Filled 2016-07-26: qty 1

## 2016-07-26 MED ORDER — POLYETHYLENE GLYCOL 3350 17 G PO PACK: 17.0000 g | PACK | Freq: Every day | ORAL | Status: DC | PRN

## 2016-07-26 MED ORDER — MIDAZOLAM HCL 5 MG/5ML IJ SOLN
INTRAMUSCULAR | Status: DC | PRN
  Administered 2016-07-26: 2 mg via INTRAVENOUS

## 2016-07-26 MED ORDER — ATENOLOL 25 MG PO TABS
100.0000 mg | ORAL_TABLET | Freq: Two times a day (BID) | ORAL | Status: DC
  Administered 2016-07-27 – 2016-07-28 (×2): 100 mg via ORAL
  Filled 2016-07-26 (×4): qty 4

## 2016-07-26 MED ORDER — STERILE WATER FOR IRRIGATION IR SOLN
Status: DC | PRN
  Administered 2016-07-26: 2000 mL

## 2016-07-26 MED ORDER — MIDAZOLAM HCL 2 MG/2ML IJ SOLN: 0.5000 mg | Freq: Once | INTRAMUSCULAR | Status: DC | PRN

## 2016-07-26 MED ORDER — ACETAMINOPHEN 325 MG PO TABS
650.0000 mg | ORAL_TABLET | Freq: Four times a day (QID) | ORAL | Status: DC | PRN
  Administered 2016-07-27: 22:00:00 650 mg via ORAL
  Filled 2016-07-26: qty 2

## 2016-07-26 MED ORDER — BUPIVACAINE HCL 0.25 % IJ SOLN
INTRAMUSCULAR | Status: DC | PRN
  Administered 2016-07-26: 20 mL

## 2016-07-26 MED ORDER — PROPOFOL 10 MG/ML IV BOLUS
INTRAVENOUS | Status: AC
  Filled 2016-07-26: qty 40

## 2016-07-26 MED ORDER — DEXAMETHASONE SODIUM PHOSPHATE 10 MG/ML IJ SOLN
10.0000 mg | Freq: Once | INTRAMUSCULAR | Status: AC
  Administered 2016-07-26: 10 mg via INTRAVENOUS

## 2016-07-26 MED ORDER — PROPOFOL 10 MG/ML IV BOLUS
INTRAVENOUS | Status: AC
  Filled 2016-07-26: qty 20

## 2016-07-26 MED ORDER — PROMETHAZINE HCL 25 MG/ML IJ SOLN: 6.2500 mg | INTRAMUSCULAR | Status: DC | PRN

## 2016-07-26 MED ORDER — METOCLOPRAMIDE HCL 5 MG/ML IJ SOLN: 5.0000 mg | Freq: Three times a day (TID) | INTRAMUSCULAR | Status: DC | PRN

## 2016-07-26 MED ORDER — BUPIVACAINE HCL (PF) 0.25 % IJ SOLN
INTRAMUSCULAR | Status: AC
  Filled 2016-07-26: qty 30

## 2016-07-26 MED ORDER — HYDROMORPHONE HCL 2 MG/ML IJ SOLN
0.2500 mg | INTRAMUSCULAR | Status: DC | PRN
  Administered 2016-07-26 (×2): 0.5 mg via INTRAVENOUS

## 2016-07-26 MED ORDER — ONDANSETRON HCL 4 MG/2ML IJ SOLN
INTRAMUSCULAR | Status: AC
  Filled 2016-07-26: qty 2

## 2016-07-26 MED ORDER — MENTHOL 3 MG MT LOZG: 1.0000 | LOZENGE | OROMUCOSAL | Status: DC | PRN

## 2016-07-26 MED ORDER — ONDANSETRON HCL 4 MG/2ML IJ SOLN: 4.0000 mg | Freq: Four times a day (QID) | INTRAMUSCULAR | Status: DC | PRN

## 2016-07-26 MED ORDER — SODIUM CHLORIDE 0.9 % IJ SOLN
INTRAMUSCULAR | Status: AC
  Filled 2016-07-26: qty 50

## 2016-07-26 MED ORDER — METOCLOPRAMIDE HCL 5 MG PO TABS: 5.0000 mg | ORAL_TABLET | Freq: Three times a day (TID) | ORAL | Status: DC | PRN

## 2016-07-26 MED ORDER — LACTATED RINGERS IV SOLN
INTRAVENOUS | Status: DC
  Administered 2016-07-26 (×2): via INTRAVENOUS

## 2016-07-26 MED ORDER — TRAMADOL HCL 50 MG PO TABS
50.0000 mg | ORAL_TABLET | Freq: Four times a day (QID) | ORAL | Status: DC | PRN
  Administered 2016-07-27 – 2016-07-28 (×2): 100 mg via ORAL
  Filled 2016-07-26 (×2): qty 2

## 2016-07-26 MED ORDER — ONDANSETRON HCL 4 MG PO TABS: 4.0000 mg | ORAL_TABLET | Freq: Four times a day (QID) | ORAL | Status: DC | PRN

## 2016-07-26 MED ORDER — BUPIVACAINE IN DEXTROSE 0.75-8.25 % IT SOLN
INTRATHECAL | Status: DC | PRN
  Administered 2016-07-26: 1.8 mL via INTRATHECAL

## 2016-07-26 MED ORDER — PROPOFOL 500 MG/50ML IV EMUL
INTRAVENOUS | Status: DC | PRN
  Administered 2016-07-26: 75 ug/kg/min via INTRAVENOUS

## 2016-07-26 MED ORDER — SODIUM CHLORIDE 0.9 % IR SOLN
Status: DC | PRN
  Administered 2016-07-26: 1000 mL

## 2016-07-26 MED ORDER — RIVAROXABAN 10 MG PO TABS
10.0000 mg | ORAL_TABLET | Freq: Every day | ORAL | Status: DC
  Administered 2016-07-27 – 2016-07-28 (×2): 10 mg via ORAL
  Filled 2016-07-26 (×2): qty 1

## 2016-07-26 MED ORDER — METHOCARBAMOL 500 MG PO TABS
500.0000 mg | ORAL_TABLET | Freq: Four times a day (QID) | ORAL | Status: DC | PRN
  Administered 2016-07-27 – 2016-07-28 (×3): 500 mg via ORAL
  Filled 2016-07-26 (×3): qty 1

## 2016-07-26 MED ORDER — MEPERIDINE HCL 50 MG/ML IJ SOLN
6.2500 mg | INTRAMUSCULAR | Status: DC | PRN
Start: 2016-07-26 — End: 2016-07-26

## 2016-07-26 MED ORDER — FENTANYL CITRATE (PF) 100 MCG/2ML IJ SOLN
INTRAMUSCULAR | Status: AC
  Filled 2016-07-26: qty 2

## 2016-07-26 MED ORDER — BUPIVACAINE LIPOSOME 1.3 % IJ SUSP
20.0000 mL | Freq: Once | INTRAMUSCULAR | Status: DC
  Filled 2016-07-26: qty 20

## 2016-07-26 MED ORDER — ACETAMINOPHEN 10 MG/ML IV SOLN
1000.0000 mg | Freq: Once | INTRAVENOUS | Status: AC
  Administered 2016-07-26: 1000 mg via INTRAVENOUS

## 2016-07-26 MED ORDER — OXYCODONE HCL 5 MG PO TABS
5.0000 mg | ORAL_TABLET | ORAL | Status: DC | PRN
  Administered 2016-07-26 (×3): 10 mg via ORAL
  Administered 2016-07-26: 5 mg via ORAL
  Administered 2016-07-27 – 2016-07-28 (×9): 10 mg via ORAL
  Filled 2016-07-26 (×3): qty 2
  Filled 2016-07-26: qty 1
  Filled 2016-07-26 (×10): qty 2

## 2016-07-26 MED ORDER — BISACODYL 10 MG RE SUPP: 10.0000 mg | Freq: Every day | RECTAL | Status: DC | PRN

## 2016-07-26 MED ORDER — DEXAMETHASONE SODIUM PHOSPHATE 10 MG/ML IJ SOLN
10.0000 mg | Freq: Once | INTRAMUSCULAR | Status: AC
  Administered 2016-07-27: 08:00:00 10 mg via INTRAVENOUS
  Filled 2016-07-26: qty 1

## 2016-07-26 MED ORDER — SODIUM CHLORIDE 0.9 % IJ SOLN
INTRAMUSCULAR | Status: DC | PRN
  Administered 2016-07-26: 20 mL

## 2016-07-26 MED ORDER — 0.9 % SODIUM CHLORIDE (POUR BTL) OPTIME
TOPICAL | Status: DC | PRN
  Administered 2016-07-26: 1000 mL

## 2016-07-26 MED ORDER — EPHEDRINE 5 MG/ML INJ
INTRAVENOUS | Status: AC
  Filled 2016-07-26: qty 10

## 2016-07-26 SURGICAL SUPPLY — 51 items
BAG DECANTER FOR FLEXI CONT (MISCELLANEOUS) ×3 IMPLANT
BAG ZIPLOCK 12X15 (MISCELLANEOUS) ×3 IMPLANT
BANDAGE ACE 6X5 VEL STRL LF (GAUZE/BANDAGES/DRESSINGS) ×3 IMPLANT
BLADE SAG 18X100X1.27 (BLADE) ×3 IMPLANT
BLADE SAW SGTL 11.0X1.19X90.0M (BLADE) ×3 IMPLANT
BOWL SMART MIX CTS (DISPOSABLE) ×3 IMPLANT
CAPT KNEE TOTAL 3 ATTUNE ×3 IMPLANT
CEMENT HV SMART SET (Cement) ×6 IMPLANT
CLOSURE WOUND 1/2 X4 (GAUZE/BANDAGES/DRESSINGS) ×2
CLOTH BEACON ORANGE TIMEOUT ST (SAFETY) ×3 IMPLANT
CUFF TOURN SGL QUICK 34 (TOURNIQUET CUFF) ×2
CUFF TRNQT CYL 34X4X40X1 (TOURNIQUET CUFF) ×1 IMPLANT
DECANTER SPIKE VIAL GLASS SM (MISCELLANEOUS) ×3 IMPLANT
DRAPE U-SHAPE 47X51 STRL (DRAPES) ×3 IMPLANT
DRSG ADAPTIC 3X8 NADH LF (GAUZE/BANDAGES/DRESSINGS) ×3 IMPLANT
DURAPREP 26ML APPLICATOR (WOUND CARE) ×3 IMPLANT
ELECT REM PT RETURN 9FT ADLT (ELECTROSURGICAL) ×3
ELECTRODE REM PT RTRN 9FT ADLT (ELECTROSURGICAL) ×1 IMPLANT
EVACUATOR 1/8 PVC DRAIN (DRAIN) ×3 IMPLANT
GAUZE SPONGE 4X4 12PLY STRL (GAUZE/BANDAGES/DRESSINGS) ×3 IMPLANT
GLOVE BIO SURGEON STRL SZ8 (GLOVE) ×3 IMPLANT
GLOVE BIOGEL PI IND STRL 6.5 (GLOVE) ×1 IMPLANT
GLOVE BIOGEL PI IND STRL 7.5 (GLOVE) ×1 IMPLANT
GLOVE BIOGEL PI IND STRL 8 (GLOVE) ×1 IMPLANT
GLOVE BIOGEL PI INDICATOR 6.5 (GLOVE) ×2
GLOVE BIOGEL PI INDICATOR 7.5 (GLOVE) ×2
GLOVE BIOGEL PI INDICATOR 8 (GLOVE) ×2
GLOVE SURG SS PI 6.5 STRL IVOR (GLOVE) ×3 IMPLANT
GLOVE SURG SS PI 7.0 STRL IVOR (GLOVE) ×3 IMPLANT
GLOVE SURG SS PI 7.5 STRL IVOR (GLOVE) ×15 IMPLANT
GOWN STRL REUS W/ TWL XL LVL3 (GOWN DISPOSABLE) ×1 IMPLANT
GOWN STRL REUS W/TWL LRG LVL3 (GOWN DISPOSABLE) ×6 IMPLANT
GOWN STRL REUS W/TWL XL LVL3 (GOWN DISPOSABLE) ×5 IMPLANT
HANDPIECE INTERPULSE COAX TIP (DISPOSABLE) ×2
IMMOBILIZER KNEE 20 (SOFTGOODS) ×3
IMMOBILIZER KNEE 20 THIGH 36 (SOFTGOODS) ×1 IMPLANT
MANIFOLD NEPTUNE II (INSTRUMENTS) ×3 IMPLANT
PACK TOTAL KNEE CUSTOM (KITS) ×3 IMPLANT
PAD ABD 8X10 STRL (GAUZE/BANDAGES/DRESSINGS) ×3 IMPLANT
PADDING CAST COTTON 6X4 STRL (CAST SUPPLIES) ×9 IMPLANT
POSITIONER SURGICAL ARM (MISCELLANEOUS) ×3 IMPLANT
SET HNDPC FAN SPRY TIP SCT (DISPOSABLE) ×1 IMPLANT
STRIP CLOSURE SKIN 1/2X4 (GAUZE/BANDAGES/DRESSINGS) ×4 IMPLANT
SUT MNCRL AB 4-0 PS2 18 (SUTURE) ×3 IMPLANT
SUT VIC AB 2-0 CT1 27 (SUTURE) ×6
SUT VIC AB 2-0 CT1 TAPERPNT 27 (SUTURE) ×3 IMPLANT
SUT VLOC 180 0 24IN GS25 (SUTURE) ×3 IMPLANT
SYR 50ML LL SCALE MARK (SYRINGE) ×3 IMPLANT
TRAY FOLEY CATH SILVER 14FR (SET/KITS/TRAYS/PACK) ×3 IMPLANT
WRAP KNEE MAXI GEL POST OP (GAUZE/BANDAGES/DRESSINGS) ×3 IMPLANT
YANKAUER SUCT BULB TIP 10FT TU (MISCELLANEOUS) ×3 IMPLANT

## 2016-07-26 NOTE — Interval H&P Note (Signed)
History and Physical Interval Note:  07/26/2016 6:43 AM  Jennifer Russell  has presented today for surgery, with the diagnosis of RIGHT KNEE OA  The various methods of treatment have been discussed with the patient and family. After consideration of risks, benefits and other options for treatment, the patient has consented to  Procedure(s): RIGHT TOTAL KNEE ARTHROPLASTY (Right) as a surgical intervention .  The patient's history has been reviewed, patient examined, no change in status, stable for surgery.  I have reviewed the patient's chart and labs.  Questions were answered to the patient's satisfaction.     Loanne DrillingALUISIO,Dwayn Moravek V

## 2016-07-26 NOTE — H&P (View-Only) (Signed)
Jennifer Russell DOB: 10/18/1950 Married / Language: English / Race: White Female Date of Admission:  07/26/2016 CC:  Right Knee Pain History of Present Illness The patient is a 65 year old female who comes in for a preoperative History and Physical. The patient is scheduled for a right total knee arthroplasty to be performed by Dr. Frank V. Aluisio, MD at [facility] on 07-26-2016. The patient is a 65 year old female who presented for follow up of their knee. The patient is being followed for their right knee pain and osteoarthritis. They are now month(s) out from cortisone injection (that did provide some relief). Symptoms reported include: pain (mainly with weightbearing) and stiffness. The following medication has been used for pain control: none. The knee is unfortunately getting progressively worse over time. She is not getting swelling. It is not giving out. She does have significant pain medially. It is limiting what she can and cannot do. She wants to be more active, but cannot do so because of the knee. It does hurt at night at times. She is at a stage where she feels like she needs to get something done because of the significant pain and dysfunction in the knee. Radiographs show bone on bone arthritis in the medial and patellofemoral compartments of that right knee. She has significant pain and dysfunction. At this point, the most predictable means of improving her pain and function would be total knee arthroplasty. She has had the other side done so she is familiar with the this. Discussed with her the differences now compared to when she had the other side done. We discussed those in detail. She would like to proceed with right total knee arthroplasty.  They have been treated conservatively in the past for the above stated problem and despite conservative measures, they continue to have progressive pain and severe functional limitations and dysfunction. They have failed  non-operative management including home exercise, medications, and injections. It is felt that they would benefit from undergoing total joint replacement. Risks and benefits of the procedure have been discussed with the patient and they elect to proceed with surgery. There are no active contraindications to surgery such as ongoing infection or rapidly progressive neurological disease.  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 sister Cancer  First Degree Relatives. brother  Social History  Tobacco use  Never smoker. never smoker Tobacco / smoke exposure  no Pain Contract  yes Illicit drug use  no Exercise  Exercises daily; does other and gym / weights Drug/Alcohol Rehab (Previously)  no Number of flights of stairs before winded  greater than 5 Marital status  married Living situation  live with spouse Alcohol use  current drinker; drinks wine; 5-7 per week Drug/Alcohol Rehab (Currently)  no Children  2  Medication   History  Relpax (20MG  Tablet, Oral) Active. (prn migranes) Vitamin D (1 Oral) Specific strength unknown - Active. (qd) Atenolol (100MG  Tablet, Oral) Active. (qd)  Past  Surgical History Dilation and Curettage of Uterus - Multiple  Arthroscopy of Knee  left Hysterectomy  partial (non-cancerous) Foot Surgery  bilateral Tubal Ligation  Total Knee Replacement  left Sinus Surgery    Review of Systems General Not Present- Chills, Fatigue, Fever, Memory Loss, Night Sweats, Weight Gain and Weight Loss. Skin Not Present- Eczema, Hives, Itching, Lesions and Rash. HEENT Not Present- Dentures, Double Vision, Headache, Hearing Loss, Tinnitus and Visual Loss. Respiratory Not Present- Allergies, Chronic Cough, Coughing up blood, Shortness of breath at rest and Shortness of breath with exertion. Cardiovascular Not Present- Chest Pain, Difficulty Breathing Lying Down, Murmur, Palpitations, Racing/skipping heartbeats and Swelling. Gastrointestinal Not Present- Abdominal Pain, Bloody Stool, Constipation, Diarrhea, Difficulty Swallowing, Heartburn, Jaundice, Loss of appetitie, Nausea and Vomiting. Female Genitourinary Not Present- Blood in Urine, Discharge, Flank Pain, Incontinence, Painful Urination, Urgency, Urinary frequency, Urinary Retention, Urinating at Night and Weak urinary stream. Musculoskeletal Present- Joint Swelling. Not Present- Back Pain, Joint Pain, Morning Stiffness, Muscle Pain, Muscle Weakness and Spasms. Neurological Not Present- Blackout spells, Difficulty with balance, Dizziness, Paralysis, Tremor and Weakness. Psychiatric Not Present- Insomnia.  Vitals  Weight: 174 lb Height: 66.5in Weight was reported by patient. Height was reported by patient. Body Surface Area: 1.9 m Body Mass Index: 27.66 kg/m  Pulse: 62 (Regular)  BP: 142/82 (Sitting, Right Arm, Standard)   Physical Exam General Mental Status -Alert, cooperative and good historian. General Appearance-pleasant, Not in acute distress. Orientation-Oriented X3. Build & Nutrition-Well nourished and Well developed.  Head and Neck Head-normocephalic, atraumatic  . Neck Global Assessment - supple, no bruit auscultated on the right, no bruit auscultated on the left.  Eye Vision-Wears corrective lenses. Pupil - Bilateral-Regular and Round. Motion - Bilateral-EOMI.  Chest and Lung Exam Auscultation Breath sounds - clear at anterior chest wall and clear at posterior chest wall. Adventitious sounds - No Adventitious sounds.  Cardiovascular Auscultation Rhythm - Regular rate and rhythm. Heart Sounds - S1 WNL and S2 WNL. Murmurs & Other Heart Sounds - Auscultation of the heart reveals - No Murmurs.  Abdomen Palpation/Percussion Tenderness - Abdomen is non-tender to palpation. Rigidity (guarding) - Abdomen is soft. Auscultation Auscultation of the abdomen reveals - Bowel sounds normal.  Female Genitourinary Note: Not done, not pertinent to present illness   Musculoskeletal Note: A well-developed female, alert, oriented, in no apparent distress. Evaluation of her right knee shows no effusion. Range about 5 to 130. There is moderate crepitus on range of motion with some tenderness medial greater than lateral. No instability noted. Pulse, sensation, motor intact. She has got significant antalgic gait pattern.  RADIOGRAPHS Do show bone on bone arthritis in the medial and patellofemoral compartments of that right knee.  Assessment & Plan Primary osteoarthritis of right knee (M17.11)  Note:Surgical Plans: Right Total Knee Replacement  Disposition: Home  PCP: Dr. Laurann Montanaynthia White  Topical TXA - History of Pulmonary Embolism  Anesthesia Issues: None  Signed electronically by Beckey RutterAlezandrew L Perkins, III PA-C

## 2016-07-26 NOTE — Anesthesia Preprocedure Evaluation (Signed)
Anesthesia Evaluation  Patient identified by MRN, date of birth, ID band Patient awake    Reviewed: Allergy & Precautions, NPO status , Patient's Chart, lab work & pertinent test results, reviewed documented beta blocker date and time   History of Anesthesia Complications Negative for: history of anesthetic complications  Airway Mallampati: III  TM Distance: >3 FB Neck ROM: Full  Mouth opening: Limited Mouth Opening  Dental  (+) Dental Advisory Given   Pulmonary neg pulmonary ROS,    breath sounds clear to auscultation       Cardiovascular hypertension, Pt. on medications and Pt. on home beta blockers (-) angina+ Valvular Problems/Murmurs MVP  Rhythm:Regular Rate:Normal     Neuro/Psych negative neurological ROS     GI/Hepatic negative GI ROS, Neg liver ROS,   Endo/Other  negative endocrine ROS  Renal/GU negative Renal ROS     Musculoskeletal  (+) Arthritis , Osteoarthritis,    Abdominal   Peds  Hematology negative hematology ROS (+)   Anesthesia Other Findings   Reproductive/Obstetrics                             Anesthesia Physical Anesthesia Plan  ASA: II  Anesthesia Plan: Spinal   Post-op Pain Management:    Induction:   Airway Management Planned: Natural Airway and Simple Face Mask  Additional Equipment:   Intra-op Plan:   Post-operative Plan:   Informed Consent: I have reviewed the patients History and Physical, chart, labs and discussed the procedure including the risks, benefits and alternatives for the proposed anesthesia with the patient or authorized representative who has indicated his/her understanding and acceptance.   Dental advisory given  Plan Discussed with: CRNA and Surgeon  Anesthesia Plan Comments: (Plan routine monitors, SAB)        Anesthesia Quick Evaluation

## 2016-07-26 NOTE — Progress Notes (Signed)
Orthopedic Tech Progress Note Patient Details:  Jennifer Russell June 16, 1951 [id number]  Patient not on Ortho bed could not apply OHF w/ Trapeze. CPM Right Knee CPM Right Knee: On Right Knee Flexion (Degrees): 40 Right Knee Extension (Degrees): 10 Additional Comments: Foot Board applied   Jennifer Russell 07/26/2016, 9:37 AM

## 2016-07-26 NOTE — Transfer of Care (Signed)
Immediate Anesthesia Transfer of Care Note  Patient: Jennifer Russell  Procedure(s) Performed: Procedure(s): RIGHT TOTAL KNEE ARTHROPLASTY (Right)  Patient Location: PACU  Anesthesia Type:MAC  Level of Consciousness:  sedated, patient cooperative and responds to stimulation  Airway & Oxygen Therapy:Patient Spontanous Breathing and Patient connected to face mask oxgen  Post-op Assessment:  Report given to PACU RN and Post -op Vital signs reviewed and stable  Post vital signs:  Reviewed and stable  Last Vitals:  Vitals:   07/26/16 0548  BP: (!) 148/71  Pulse: 65  Resp: 16  Temp: 36.6 C    Complications: No apparent anesthesia complications

## 2016-07-26 NOTE — Op Note (Signed)
OPERATIVE REPORT-TOTAL KNEE ARTHROPLASTY   Pre-operative diagnosis- Osteoarthritis  Right knee(s)  Post-operative diagnosis- Osteoarthritis Right knee(s)  Procedure-  Right  Total Knee Arthroplasty (Depuy Attune)  Surgeon- Gus RankinFrank V. Conroy Goracke, MD  Assistant- Dimitri PedAmber Constable, PA-C   Anesthesia-  Spinal  EBL-* No blood loss amount entered *   Drains Hemovac  Tourniquet time-  Total Tourniquet Time Documented: Thigh (Right) - 35 minutes Total: Thigh (Right) - 35 minutes     Complications- None  Condition-PACU - hemodynamically stable.   Brief Clinical Note  Jennifer Russell is a 65 y.o. year old female with end stage OA of her right knee with progressively worsening pain and dysfunction. She has constant pain, with activity and at rest and significant functional deficits with difficulties even with ADLs. She has had extensive non-op management including analgesics, injections of cortisone and viscosupplements, and home exercise program, but remains in significant pain with significant dysfunction.Radiographs show bone on bone arthritis medial and patellofemoral. She presents now for right Total Knee Arthroplasty.    Procedure in detail---   The patient is brought into the operating room and positioned supine on the operating table. After successful administration of  Spinal,   a tourniquet is placed high on the  Right thigh(s) and the lower extremity is prepped and draped in the usual sterile fashion. Time out is performed by the operating team and then the  Right lower extremity is wrapped in Esmarch, knee flexed and the tourniquet inflated to 300 mmHg.       A midline incision is made with a ten blade through the subcutaneous tissue to the level of the extensor mechanism. A fresh blade is used to make a medial parapatellar arthrotomy. Soft tissue over the proximal medial tibia is subperiosteally elevated to the joint line with a knife and into the semimembranosus bursa with a Cobb  elevator. Soft tissue over the proximal lateral tibia is elevated with attention being paid to avoiding the patellar tendon on the tibial tubercle. The patella is everted, knee flexed 90 degrees and the ACL and PCL are removed. Findings are bone on bone medial and patellofemoral with large global osteophytes.        The drill is used to create a starting hole in the distal femur and the canal is thoroughly irrigated with sterile saline to remove the fatty contents. The 5 degree Right  valgus alignment guide is placed into the femoral canal and the distal femoral cutting block is pinned to remove 9 mm off the distal femur. Resection is made with an oscillating saw.      The tibia is subluxed forward and the menisci are removed. The extramedullary alignment guide is placed referencing proximally at the medial aspect of the tibial tubercle and distally along the second metatarsal axis and tibial crest. The block is pinned to remove 2mm off the more deficient medial  side. Resection is made with an oscillating saw. Size 5is the most appropriate size for the tibia and the proximal tibia is prepared with the modular drill and keel punch for that size.      The femoral sizing guide is placed and size 5 is most appropriate. Rotation is marked off the epicondylar axis and confirmed by creating a rectangular flexion gap at 90 degrees. The size 5 cutting block is pinned in this rotation and the anterior, posterior and chamfer cuts are made with the oscillating saw. The intercondylar block is then placed and that cut is made.  Trial size 5 tibial component, trial size 5 posterior stabilized femur and a 8  mm posterior stabilized rotating platform insert trial is placed. Full extension is achieved with excellent varus/valgus and anterior/posterior balance throughout full range of motion. The patella is everted and thickness measured to be 22  mm. Free hand resection is taken to 12 mm, a 35 template is placed, lug holes  are drilled, trial patella is placed, and it tracks normally. Osteophytes are removed off the posterior femur with the trial in place. All trials are removed and the cut bone surfaces prepared with pulsatile lavage. Cement is mixed and once ready for implantation, the size 5 tibial implant, size  5 posterior stabilized femoral component, and the size 35 patella are cemented in place and the patella is held with the clamp. The trial insert is placed and the knee held in full extension. The Exparel (20 ml mixed with 30 ml saline) and .25% Bupivicaine, are injected into the extensor mechanism, posterior capsule, medial and lateral gutters and subcutaneous tissues.  All extruded cement is removed and once the cement is hard the permanent 8 mm posterior stabilized rotating platform insert is placed into the tibial tray.      The wound is copiously irrigated with saline solution and the extensor mechanism closed over a hemovac drain with #1 V-loc suture. The tourniquet is released for a total tourniquet time of 35  minutes. Flexion against gravity is 140 degrees and the patella tracks normally. Subcutaneous tissue is closed with 2.0 vicryl and subcuticular with running 4.0 Monocryl. The incision is cleaned and dried and steri-strips and a bulky sterile dressing are applied. The limb is placed into a knee immobilizer and the patient is awakened and transported to recovery in stable condition.      Please note that a surgical assistant was a medical necessity for this procedure in order to perform it in a safe and expeditious manner. Surgical assistant was necessary to retract the ligaments and vital neurovascular structures to prevent injury to them and also necessary for proper positioning of the limb to allow for anatomic placement of the prosthesis.   Gus RankinFrank V. Cire Clute, MD    07/26/2016, 8:20 AM

## 2016-07-26 NOTE — Anesthesia Postprocedure Evaluation (Signed)
Anesthesia Post Note  Patient: [name]  Procedure(s) Performed: Procedure(s) (LRB): RIGHT TOTAL KNEE ARTHROPLASTY (Right)  Patient location during evaluation: PACU Anesthesia Type: Spinal Level of consciousness: awake and alert, oriented and patient cooperative Pain management: pain level controlled Vital Signs Assessment: post-procedure vital signs reviewed and stable Respiratory status: spontaneous breathing, nonlabored ventilation, respiratory function stable and patient connected to nasal cannula oxygen Cardiovascular status: blood pressure returned to baseline and stable Postop Assessment: no signs of nausea or vomiting, spinal receding and patient able to bend at knees Anesthetic complications: no    Last Vitals:  Vitals:   07/26/16 0932 07/26/16 0945  BP:  127/72  Pulse: (!) 59   Resp:  12  Temp:  36.4 C    Last Pain:  Vitals:   07/26/16 0930  TempSrc:   PainSc: 0-No pain                 Orion Vandervort,E. Arabel Barcenas

## 2016-07-26 NOTE — Anesthesia Procedure Notes (Signed)
Spinal  Patient location during procedure: OR Start time: 07/26/2016 7:15 AM End time: 07/26/2016 7:19 AM Reason for block: at surgeon's request Staffing Resident/CRNA: Anne Fu Performed: resident/CRNA  Preanesthetic Checklist Completed: patient identified, site marked, surgical consent, pre-op evaluation, timeout performed, IV checked, risks and benefits discussed and monitors and equipment checked Spinal Block Patient position: sitting Prep: Betadine Patient monitoring: heart rate, continuous pulse ox and blood pressure Approach: right paramedian Location: L2-3 Injection technique: single-shot Needle Needle type: Pencan  Needle gauge: 24 G Needle length: 9 cm Assessment Sensory level: T6 Additional Notes Expiration date of kit checked and confirmed. Patient tolerated procedure well, without complications. X 1 attempt with noted clear CSF return. Loss of motor and sensory on exam post injection.

## 2016-07-26 NOTE — Evaluation (Signed)
Physical Therapy Evaluation Patient Details Name: Jennifer Russell MRN: [id number] DOB: 04-06-1951 Today's Date: 07/26/2016   History of Present Illness  R TKA, h/o L TKA 7 yrs ago  Clinical Impression  Pt is s/p TKA resulting in the deficits listed below (see PT Problem List). Pt ambulated 30' with RW, performed TKA exercises with min A. Good progress expected.  Pt will benefit from skilled PT to increase their independence and safety with mobility to allow discharge to the venue listed below.      Follow Up Recommendations Home health PT    Equipment Recommendations  None recommended by PT    Recommendations for Other Services       Precautions / Restrictions Precautions Precautions: Knee Required Braces or Orthoses: Knee Immobilizer - Right Restrictions Weight Bearing Restrictions: No Other Position/Activity Restrictions: WBAT RLE      Mobility  Bed Mobility Overal bed mobility: Needs Assistance Bed Mobility: Supine to Sit     Supine to sit: Min assist     General bed mobility comments: min A RLE, instructed to self assist RLE with LLE  Transfers Overall transfer level: Needs assistance Equipment used: Rolling walker (2 wheeled) Transfers: Sit to/from Stand Sit to Stand: Min assist         General transfer comment: VCs hand placement, min A to rise  Ambulation/Gait Ambulation/Gait assistance: Min guard Ambulation Distance (Feet): 30 Feet Assistive device: Rolling walker (2 wheeled) Gait Pattern/deviations: Step-to pattern;Decreased step length - right;Decreased step length - left;Antalgic   Gait velocity interpretation: Below normal speed for age/gender General Gait Details: VCs positioning in RW, posture, and sequencing; no LOB, distance limited by pain  Stairs            Wheelchair Mobility    Modified Rankin (Stroke Patients Only)       Balance Overall balance assessment: Modified Independent                                            Pertinent Vitals/Pain Pain Assessment: 0-10 Pain Score: 5  Pain Location: R knee Pain Descriptors / Indicators: Sharp Pain Intervention(s): Limited activity within patient's tolerance;Monitored during session;Premedicated before session;Ice applied    Home Living Family/patient expects to be discharged to:: Private residence Living Arrangements: Spouse/significant other Available Help at Discharge: Family;Available 24 hours/day Type of Home: House Home Access: Stairs to enter Entrance Stairs-Rails: Left Entrance Stairs-Number of Steps: 2 Home Layout: Two level;Bed/bath upstairs Home Equipment: Walker - 2 wheels;Cane - single point;Bedside commode;Shower seat      Prior Function Level of Independence: Independent               Hand Dominance        Extremity/Trunk Assessment   Upper Extremity Assessment Upper Extremity Assessment: Defer to OT evaluation    Lower Extremity Assessment Lower Extremity Assessment: RLE deficits/detail RLE Deficits / Details: 5-45* AAROM R knee, SLR +2/5    Cervical / Trunk Assessment Cervical / Trunk Assessment: Normal  Communication   Communication: No difficulties  Cognition Arousal/Alertness: Awake/alert Behavior During Therapy: WFL for tasks assessed/performed Overall Cognitive Status: Within Functional Limits for tasks assessed                      General Comments      Exercises Total Joint Exercises Ankle Circles/Pumps: AROM;Both;10 reps;Supine Quad Sets: AROM;Both;10 reps;Supine Heel Slides: AAROM;Right;10  reps;Supine Straight Leg Raises: AAROM;Right;5 reps;Supine Goniometric ROM: 5-45* AAROM R knee   Assessment/Plan    PT Assessment    PT Problem List            PT Treatment Interventions      PT Goals (Current goals can be found in the Care Plan section)  Acute Rehab PT Goals Patient Stated Goal: travel, play with grandkids PT Goal Formulation: With patient Time For Goal  Achievement: 08/02/16 Potential to Achieve Goals: Good    Frequency     Barriers to discharge        Co-evaluation               End of Session Equipment Utilized During Treatment: Gait belt;Right knee immobilizer Activity Tolerance: Patient tolerated treatment well Patient left: in chair;with call bell/phone within reach Nurse Communication: Mobility status         Time: [id number] PT Time Calculation (min) (ACUTE ONLY): 32 min   Charges:   PT Evaluation $PT Eval Low Complexity: 1 Procedure PT Treatments $Gait Training: 8-22 mins   PT G Codes:        Tamala SerUhlenberg, Kynsli Haapala Kistler 07/26/2016, 2:46 PM [phone]

## 2016-07-27 ENCOUNTER — Encounter (HOSPITAL_COMMUNITY): Payer: Self-pay | Admitting: Orthopedic Surgery

## 2016-07-27 LAB — CBC
HCT: 35.6 % — ABNORMAL LOW (ref 36.0–46.0)
Hemoglobin: 12.2 g/dL (ref 12.0–15.0)
MCH: 30.4 pg (ref 26.0–34.0)
MCHC: 34.3 g/dL (ref 30.0–36.0)
MCV: 88.8 fL (ref 78.0–100.0)
PLATELETS: 170 10*3/uL (ref 150–400)
RBC: 4.01 MIL/uL (ref 3.87–5.11)
RDW: 12.8 % (ref 11.5–15.5)
WBC: 13 10*3/uL — ABNORMAL HIGH (ref 4.0–10.5)

## 2016-07-27 LAB — BASIC METABOLIC PANEL
Anion gap: 7 (ref 5–15)
BUN: 15 mg/dL (ref 6–20)
CALCIUM: 8.9 mg/dL (ref 8.9–10.3)
CO2: 24 mmol/L (ref 22–32)
CREATININE: 0.83 mg/dL (ref 0.44–1.00)
Chloride: 106 mmol/L (ref 101–111)
GFR calc Af Amer: >60 mL/min (ref >60)
GLUCOSE: 123 mg/dL — AB (ref 65–99)
Potassium: 4.4 mmol/L (ref 3.5–5.1)
SODIUM: 137 mmol/L (ref 135–145)

## 2016-07-27 MED ORDER — TRAMADOL HCL 50 MG PO TABS
50.0000 mg | ORAL_TABLET | Freq: Four times a day (QID) | ORAL | 1 refills | Status: DC | PRN
Start: 2016-07-27 — End: 2018-09-13

## 2016-07-27 MED ORDER — METHOCARBAMOL 500 MG PO TABS: 500.0000 mg | ORAL_TABLET | Freq: Four times a day (QID) | ORAL | 0 refills | Status: DC | PRN

## 2016-07-27 MED ORDER — OXYCODONE HCL 5 MG PO TABS: 5.0000 mg | ORAL_TABLET | ORAL | 0 refills | Status: DC | PRN

## 2016-07-27 MED ORDER — RIVAROXABAN 10 MG PO TABS
10.0000 mg | ORAL_TABLET | Freq: Every day | ORAL | 0 refills | Status: DC
Start: 2016-07-28 — End: 2018-09-13

## 2016-07-27 NOTE — Care Management Note (Signed)
Case Management Note  Patient Details  Name: Jennifer Russell MRN: [id number] Date of Birth: August 14, 1950  Subjective/Objective:                  RIGHT TOTAL KNEE ARTHROPLASTY (Right) Action/Plan: Discharge planning Expected Discharge Date:  07/28/16               Expected Discharge Plan:  [place]  In-House Referral:     Discharge planning Services  CM Consult  Post Acute Care Choice:  Home Health Choice offered to:  Patient, Spouse  DME Arranged:  N/A DME Agency:  NA  HH Arranged:  PT [place] Agency:  [facility] (formerly [employer])  Status of Service:  Completed, signed off  If discussed at [employer], dates discussed:    Additional Comments: CM met with pt and spouse of pt in room to offer choice of home health agency. Pt chooses [facility] to render HHPT. Referral given to [facility] rep, Tim.  Pt states she has all DME needed at home.  No other CM needs were communicated. Dellie Catholic, RN 07/27/2016, 3:30 PM

## 2016-07-27 NOTE — Discharge Instructions (Addendum)
° °Dr. Frank Aluisio °Total Joint Specialist °[facility] °[address] °[place], [place] [address] °[phone] ° °TOTAL KNEE REPLACEMENT POSTOPERATIVE DIRECTIONS ° °Knee Rehabilitation, Guidelines Following Surgery  °Results after knee surgery are often greatly improved when you follow the exercise, range of motion and muscle strengthening exercises prescribed by your doctor. Safety measures are also important to protect the knee from further injury. Any time any of these exercises cause you to have increased pain or swelling in your knee joint, decrease the amount until you are comfortable again and slowly increase them. If you have problems or questions, call your caregiver or physical therapist for advice.  ° °HOME CARE INSTRUCTIONS  °Remove items at home which could result in a fall. This includes throw rugs or furniture in walking pathways.  °· ICE to the affected knee every three hours for 30 minutes at a time and then as needed for pain and swelling.  Continue to use ice on the knee for pain and swelling from surgery. You may notice swelling that will progress down to the foot and ankle.  This is normal after surgery.  Elevate the leg when you are not up walking on it.   °· Continue to use the breathing machine which will help keep your temperature down.  It is common for your temperature to cycle up and down following surgery, especially at night when you are not up moving around and exerting yourself.  The breathing machine keeps your lungs expanded and your temperature down. °· Do not place pillow under knee, focus on keeping the knee straight while resting ° °DIET °You may resume your previous home diet once your are discharged from the hospital. ° °DRESSING / WOUND CARE / SHOWERING °You may shower 3 days after surgery, but keep the wounds dry during showering.  You may use an occlusive plastic wrap (Press'n Seal for example), NO SOAKING/SUBMERGING IN THE BATHTUB.  If the  bandage gets wet, change with a clean dry gauze.  If the incision gets wet, pat the wound dry with a clean towel. °You may start showering once you are discharged home but do not submerge the incision under water. Just pat the incision dry and apply a dry gauze dressing on daily. °Change the surgical dressing daily and reapply a dry dressing each time. ° °ACTIVITY °Walk with your walker as instructed. °Use walker as long as suggested by your caregivers. °Avoid periods of inactivity such as sitting longer than an hour when not asleep. This helps prevent blood clots.  °You may resume a sexual relationship in one month or when given the OK by your doctor.  °You may return to work once you are cleared by your doctor.  °Do not drive a car for 6 weeks or until released by you surgeon.  °Do not drive while taking narcotics. ° °WEIGHT BEARING °Weight bearing as tolerated with assist device (walker, cane, etc) as directed, use it as long as suggested by your surgeon or therapist, typically at least 4-6 weeks. ° °POSTOPERATIVE CONSTIPATION PROTOCOL °Constipation - defined medically as fewer than three stools per week and severe constipation as less than one stool per week. ° °One of the most common issues patients have following surgery is constipation.  Even if you have a regular bowel pattern at home, your normal regimen is likely to be disrupted due to multiple reasons following surgery.  Combination of anesthesia, postoperative narcotics, change in appetite and fluid intake all can affect your bowels.    In order to avoid complications following surgery, here are some recommendations in order to help you during your recovery period. ° °Colace (docusate) - Pick up an over-the-counter form of Colace or another stool softener and take twice a day as long as you are requiring postoperative pain medications.  Take with a full glass of water daily.  If you experience loose stools or diarrhea, hold the colace until you stool forms  back up.  If your symptoms do not get better within 1 week or if they get worse, check with your doctor. ° °Dulcolax (bisacodyl) - Pick up over-the-counter and take as directed by the product packaging as needed to assist with the movement of your bowels.  Take with a full glass of water.  Use this product as needed if not relieved by Colace only.  ° °MiraLax (polyethylene glycol) - Pick up over-the-counter to have on hand.  MiraLax is a solution that will increase the amount of water in your bowels to assist with bowel movements.  Take as directed and can mix with a glass of water, juice, soda, coffee, or tea.  Take if you go more than two days without a movement. °Do not use MiraLax more than once per day. Call your doctor if you are still constipated or irregular after using this medication for 7 days in a row. ° °If you continue to have problems with postoperative constipation, please contact the office for further assistance and recommendations.  If you experience "the worst abdominal pain ever" or develop nausea or vomiting, please contact the office immediatly for further recommendations for treatment. ° °ITCHING ° If you experience itching with your medications, try taking only a single pain pill, or even half a pain pill at a time.  You can also use Benadryl over the counter for itching or also to help with sleep.  ° °TED HOSE STOCKINGS °Wear the elastic stockings on both legs for three weeks following surgery during the day but you may remove then at night for sleeping. ° °MEDICATIONS °See your medication summary on the “After Visit Summary” that the nursing staff will review with you prior to discharge.  You may have some home medications which will be placed on hold until you complete the course of blood thinner medication.  It is important for you to complete the blood thinner medication as prescribed by your surgeon.  Continue your approved medications as instructed at time of  discharge. ° °PRECAUTIONS °If you experience chest pain or shortness of breath - call 911 immediately for transfer to the hospital emergency department.  °If you develop a fever greater that 101 F, purulent drainage from wound, increased redness or drainage from wound, foul odor from the wound/dressing, or calf pain - CONTACT YOUR SURGEON.   °                                                °FOLLOW-UP APPOINTMENTS °Make sure you keep all of your appointments after your operation with your surgeon and caregivers. You should call the office at the above phone number and make an appointment for approximately two weeks after the date of your surgery or on the date instructed by your surgeon outlined in the "After Visit Summary". ° ° °RANGE OF MOTION AND STRENGTHENING EXERCISES  °Rehabilitation of the knee is important following a knee injury or   an operation. After just a few days of immobilization, the muscles of the thigh which control the knee become weakened and shrink (atrophy). Knee exercises are designed to build up the tone and strength of the thigh muscles and to improve knee motion. Often times heat used for twenty to thirty minutes before working out will loosen up your tissues and help with improving the range of motion but do not use heat for the first two weeks following surgery. These exercises can be done on a training (exercise) mat, on the floor, on a table or on a bed. Use what ever works the best and is most comfortable for you Knee exercises include:  °Leg Lifts - While your knee is still immobilized in a splint or cast, you can do straight leg raises. Lift the leg to 60 degrees, hold for 3 sec, and slowly lower the leg. Repeat 10-20 times 2-3 times daily. Perform this exercise against resistance later as your knee gets better.  °Quad and Hamstring Sets - Tighten up the muscle on the front of the thigh (Quad) and hold for 5-10 sec. Repeat this 10-20 times hourly. Hamstring sets are done by pushing the  foot backward against an object and holding for 5-10 sec. Repeat as with quad sets.  °· Leg Slides: Lying on your back, slowly slide your foot toward your buttocks, bending your knee up off the floor (only go as far as is comfortable). Then slowly slide your foot back down until your leg is flat on the floor again. °· Angel Wings: Lying on your back spread your legs to the side as far apart as you can without causing discomfort.  °A rehabilitation program following serious knee injuries can speed recovery and prevent re-injury in the future due to weakened muscles. Contact your doctor or a physical therapist for more information on knee rehabilitation.  ° °IF YOU ARE TRANSFERRED TO A SKILLED REHAB FACILITY °If the patient is transferred to a skilled rehab facility following release from the hospital, a list of the current medications will be sent to the facility for the patient to continue.  When discharged from the skilled rehab facility, please have the facility set up the patient's Home Health Physical Therapy prior to being released. Also, the skilled facility will be responsible for providing the patient with their medications at time of release from the facility to include their pain medication, the muscle relaxants, and their blood thinner medication. If the patient is still at the rehab facility at time of the two week follow up appointment, the skilled rehab facility will also need to assist the patient in arranging follow up appointment in our office and any transportation needs. ° °MAKE SURE YOU:  °Understand these instructions.  °Get help right away if you are not doing well or get worse.  ° ° °Pick up stool softner and laxative for home use following surgery while on pain medications. °Do not submerge incision under water. °Please use good hand washing techniques while changing dressing each day. °May shower starting three days after surgery. °Please use a clean towel to pat the incision dry following  showers. °Continue to use ice for pain and swelling after surgery. °Do not use any lotions or creams on the incision until instructed by your surgeon. ° °Take Xarelto for two and a half more weeks, then discontinue Xarelto. °Once the patient has completed the blood thinner regimen, then take a Baby 81 mg Aspirin daily for three more weeks. ° ° °Information   on my medicine - XARELTO® (Rivaroxaban) ° °This medication education was reviewed with me or my healthcare representative as part of my discharge preparation.  The pharmacist that spoke with me during my hospital stay was:  Gadhia, Jigna M, RPH ° °Why was Xarelto® prescribed for you? °Xarelto® was prescribed for you to reduce the risk of blood clots forming after orthopedic surgery. The medical term for these abnormal blood clots is venous thromboembolism (VTE). ° °What do you need to know about xarelto® ? °Take your Xarelto® ONCE DAILY at the same time every day. °You may take it either with or without food. ° °If you have difficulty swallowing the tablet whole, you may crush it and mix in applesauce just prior to taking your dose. ° °Take Xarelto® exactly as prescribed by your doctor and DO NOT stop taking Xarelto® without talking to the doctor who prescribed the medication.  Stopping without other VTE prevention medication to take the place of Xarelto® may increase your risk of developing a clot. ° °After discharge, you should have regular check-up appointments with your healthcare provider that is prescribing your Xarelto®.   ° °What do you do if you miss a dose? °If you miss a dose, take it as soon as you remember on the same day then continue your regularly scheduled once daily regimen the next day. Do not take two doses of Xarelto® on the same day.  ° °Important Safety Information °A possible side effect of Xarelto® is bleeding. You should call your healthcare provider right away if you experience any of the following: °? Bleeding from an injury or your  nose that does not stop. °? Unusual colored urine (red or dark brown) or unusual colored stools (red or black). °? Unusual bruising for unknown reasons. °? A serious fall or if you hit your head (even if there is no bleeding). ° °Some medicines may interact with Xarelto® and might increase your risk of bleeding while on Xarelto®. To help avoid this, consult your healthcare provider or pharmacist prior to using any new prescription or non-prescription medications, including herbals, vitamins, non-steroidal anti-inflammatory drugs (NSAIDs) and supplements. ° °This website has more information on Xarelto®: www.xarelto.com. ° ° °

## 2016-07-27 NOTE — Progress Notes (Signed)
Physical Therapy Treatment Patient Details Name: Jennifer Russell MRN: [id number] DOB: Aug 16, 1950 Today's Date: 07/27/2016    History of Present Illness R TKA, h/o L TKA 7 yrs ago    PT Comments    POD # 1 pm session Assisted OOB to amb a second time.     Follow Up Recommendations  Home health PT     Equipment Recommendations  None recommended by PT    Recommendations for Other Services       Precautions / Restrictions Precautions Precautions: Knee Precaution Comments: instructed on KI use for amb and stairs and side sleeping Required Braces or Orthoses: Knee Immobilizer - Right Restrictions Weight Bearing Restrictions: No Other Position/Activity Restrictions: WBAT RLE    Mobility  Bed Mobility Overal bed mobility: Needs Assistance Bed Mobility: Sit to Supine     Supine to sit: Min guard Sit to supine: Independent   General bed mobility comments: increased time   Transfers Overall transfer level: Needs assistance Equipment used: Rolling walker (2 wheeled) Transfers: Sit to/from Stand Sit to Stand: Min assist         General transfer comment: VCs hand placement, min A to rise  Ambulation/Gait Ambulation/Gait assistance: Min guard Ambulation Distance (Feet): 85 Feet Assistive device: Rolling walker (2 wheeled) Gait Pattern/deviations: Step-to pattern;Decreased step length - right;Decreased step length - left;Antalgic Gait velocity: decreased   General Gait Details: VCs positioning in RW, posture, and sequencing; no LOB, distance limited by pain   Stairs            Wheelchair Mobility    Modified Rankin (Stroke Patients Only)       Balance                                    Cognition Arousal/Alertness: Awake/alert Behavior During Therapy: WFL for tasks assessed/performed Overall Cognitive Status: Within Functional Limits for tasks assessed                      Exercises      General Comments         Pertinent Vitals/Pain Pain Assessment: 0-10 Pain Score: 5  Pain Location: R knee Pain Descriptors / Indicators: Discomfort;Operative site guarding Pain Intervention(s): Monitored during session;Repositioned;Ice applied    Home Living                      Prior Function            PT Goals (current goals can now be found in the care plan section) Progress towards PT goals: Progressing toward goals    Frequency           PT Plan Current plan remains appropriate    Co-evaluation             End of Session Equipment Utilized During Treatment: Gait belt;Right knee immobilizer Activity Tolerance: Patient tolerated treatment well Patient left: in chair;with call bell/phone within reach     Time: 1345-1400 PT Time Calculation (min) (ACUTE ONLY): 15 min  Charges:  $Gait Training: 8-22 mins                     G Codes:      Jennifer ShellingLori [name]  PTA [facility]  Acute  Rehab Pager      [phone]

## 2016-07-27 NOTE — Progress Notes (Signed)
   Subjective: 1 Day Post-Op Procedure(s) (LRB): RIGHT TOTAL KNEE ARTHROPLASTY (Right) Patient reports pain as mild.   Patient seen in rounds for Dr. Lequita HaltAluisio. Patient is well, but has had some minor complaints of pain in the knee, requiring pain medications We will start resume today.  Plan is to go Home after hospital stay.  Objective: Vital signs in last 24 hours: Temp:  [97.8 F (36.6 C)-98.7 F (37.1 C)] 98.7 F (37.1 C) (12/19 2130) Pulse Rate:  [59-75] 65 (12/19 2130) Resp:  [14-18] 16 (12/19 2130) BP: (111-142)/(59-69) 142/63 (12/19 2130) SpO2:  [95 %-100 %] 100 % (12/19 2130)  Intake/Output from previous day:  Intake/Output Summary (Last 24 hours) at 07/27/16 2226 Last data filed at 07/27/16 1900  Gross per 24 hour  Intake           2012.5 ml  Output             5580 ml  Net          -3567.5 ml    Intake/Output this shift: No intake/output data recorded.  Labs:  Recent Labs  07/27/16 0411  HGB 12.2    Recent Labs  07/27/16 0411  WBC 13.0*  RBC 4.01  HCT 35.6*  PLT 170    Recent Labs  07/27/16 0411  NA 137  K 4.4  CL 106  CO2 24  BUN 15  CREATININE 0.83  GLUCOSE 123*  CALCIUM 8.9   No results for input(s): LABPT, INR in the last 72 hours.  EXAM General - Patient is Alert, Appropriate and Oriented Extremity - Neurovascular intact Sensation intact distally Intact pulses distally Dorsiflexion/Plantar flexion intact Dressing - dressing C/D/I Motor Function - intact, moving foot and toes well on exam.  Hemovac pulled without difficulty.  Past Medical History:  Diagnosis Date  . Arthritis   . Headache    migraines  . Heart murmur    MVP  . Heartburn   . Hypertension   . Peripheral vascular disease (HCC)   . Pulmonary embolism (HCC) 05/1984   age 65  . Vasomotor rhinitis     Assessment/Plan: 1 Day Post-Op Procedure(s) (LRB): RIGHT TOTAL KNEE ARTHROPLASTY (Right) Principal Problem:   OA (osteoarthritis) of knee  Estimated  body mass index is 27.72 kg/m as calculated from the following:   Height as of this encounter: 5\' 7"  (1.702 m).   Weight as of this encounter: 80.3 kg (177 lb). Advance diet Up with therapy Plan for discharge tomorrow  DVT Prophylaxis - Xarelto Weight-Bearing as tolerated to right leg D/C O2 and Pulse OX and try on Room Air  Jennifer Peacerew Romelle Reiley, PA-C Orthopaedic Surgery 07/27/2016, 10:26 PM

## 2016-07-27 NOTE — Progress Notes (Signed)
Occupational Therapy Evaluation Patient Details Name: Genella MechMeg Bellavance MRN: [id number] DOB: [birth year] Today's Date: 07/27/2016    History of Present Illness R TKA, h/o L TKA 7 yrs ago   Clinical Impression   Pt making good progress. Completed all education regarding compensatory techniques for ADL and functional mobility for ADL. Pt able to return demonstrate. Husband will be able to assist with ADL as needed. OT signing off.     Follow Up Recommendations  No OT follow up;Supervision - Intermittent    Equipment Recommendations  None recommended by OT    Recommendations for Other Services       Precautions / Restrictions Precautions Precautions: Knee Precaution Comments: instructed on KI use for amb and stairs and side sleeping Required Braces or Orthoses: Knee Immobilizer - Right Restrictions Weight Bearing Restrictions: No Other Position/Activity Restrictions: WBAT RLE      Mobility Bed Mobility Overal bed mobility: Needs Assistance Bed Mobility: Sit to Supine     Supine to sit: Supervision Sit to supine: Supervision   General bed mobility comments: increased time   Transfers Overall transfer level: Needs assistance Equipment used: Rolling walker (2 wheeled) Transfers: Sit to/from UGI CorporationStand;Stand Pivot Transfers Sit to Stand: Supervision Stand pivot transfers: Supervision           Balance                                            ADL Overall ADL's : Needs assistance/impaired     Grooming: Set up   Upper Body Bathing: Set up   Lower Body Bathing: Min guard;Sit to/from stand   Upper Body Dressing : Set up   Lower Body Dressing: Minimal assistance;Sit to/from stand   Toilet Transfer: Supervision/safety;Ambulation;RW;BSC   Toileting- ArchitectClothing Manipulation and Hygiene: Supervision/safety;Sit to/from stand   Tub/ Shower Transfer: Supervision/safety;3 in Automatic Data1;Rolling walker;Ambulation;Walk-in Copywriter, advertisingshower Tub/Shower Transfer Details  (indicate cue type and reason): Educated pt on safe shower transfer technique. Pt able to return demonstrate Functional mobility during ADLs: Supervision/safety;Rolling walker       Vision     Perception     Praxis      Pertinent Vitals/Pain Pain Assessment: 0-10 Pain Score: 4  Pain Location: R knee Pain Descriptors / Indicators: Discomfort;Operative site guarding Pain Intervention(s): Limited activity within patient's tolerance;Repositioned;Ice applied     Hand Dominance     Extremity/Trunk Assessment Upper Extremity Assessment Upper Extremity Assessment: Overall WFL for tasks assessed   Lower Extremity Assessment Lower Extremity Assessment: Defer to PT evaluation   Cervical / Trunk Assessment Cervical / Trunk Assessment: Normal   Communication     Cognition Arousal/Alertness: Awake/alert Behavior During Therapy: WFL for tasks assessed/performed Overall Cognitive Status: Within Functional Limits for tasks assessed                     General Comments       Exercises       Shoulder Instructions      Home Living Family/patient expects to be discharged to:: Private residence Living Arrangements: Spouse/significant other Available Help at Discharge: Family;Available 24 hours/day Type of Home: House Home Access: Stairs to enter Entergy CorporationEntrance Stairs-Number of Steps: 2 Entrance Stairs-Rails: Left Home Layout: Two level;Bed/bath upstairs Alternate Level Stairs-Number of Steps: 13 Alternate Level Stairs-Rails: Right Bathroom Shower/Tub: Producer, television/film/videoWalk-in shower   Bathroom Toilet: Standard Bathroom Accessibility: Yes How Accessible: Accessible via walker Home Equipment:  Walker - 2 wheels;Cane - single point;Bedside commode;Shower seat          Prior Functioning/Environment Level of Independence: Independent                 OT Problem List: Decreased strength;Decreased range of motion;Decreased knowledge of use of DME or AE;Pain   OT  Treatment/Interventions:      OT Goals(Current goals can be found in the care plan section) Acute Rehab OT Goals Patient Stated Goal: travel, play with grandkids OT Goal Formulation: All assessment and education complete, DC therapy  OT Frequency:     Barriers to D/C:            Co-evaluation              End of Session Equipment Utilized During Treatment: Rolling walker;Right knee immobilizer CPM Right Knee CPM Right Knee: Off Nurse Communication: Mobility status  Activity Tolerance: Patient tolerated treatment well Patient left: in bed;with call bell/phone within reach;with SCD's reapplied   Time: 1354-1420 OT Time Calculation (min): 26 min Charges:  OT General Charges $OT Visit: 1 Procedure OT Evaluation $OT Eval Low Complexity: 1 Procedure OT Treatments $Self Care/Home Management : 8-22 mins G-Codes:    Laasia Arcos,HILLARY 07/27/2016, 3:27 PM  [facility] Jozelynn Danielson, OT/L  [phone] 07/27/2016

## 2016-07-27 NOTE — Progress Notes (Signed)
Physical Therapy Treatment Patient Details Name: Jennifer MechMeg Russell MRN: [id number] DOB: 1950-08-20 Today's Date: 07/27/2016    History of Present Illness R TKA, h/o L TKA 7 yrs ago    PT Comments    POD # 1 am session Applied KI and instructed on use.  Assisted with amb in hallway a greater distance.  Returned to room then performed TKR TE's followed by ICE.  Follow Up Recommendations  Home health PT     Equipment Recommendations  None recommended by PT    Recommendations for Other Services       Precautions / Restrictions Precautions Precautions: Knee Precaution Comments: instructed on KI use for amb and stairs and side sleeping Required Braces or Orthoses: Knee Immobilizer - Right Restrictions Weight Bearing Restrictions: No Other Position/Activity Restrictions: WBAT RLE    Mobility  Bed Mobility Overal bed mobility: Needs Assistance Bed Mobility: Supine to Sit     Supine to sit: Min guard     General bed mobility comments: increased time   Transfers Overall transfer level: Needs assistance Equipment used: Rolling walker (2 wheeled) Transfers: Sit to/from Stand Sit to Stand: Min assist         General transfer comment: VCs hand placement, min A to rise  Ambulation/Gait Ambulation/Gait assistance: Min guard Ambulation Distance (Feet): 38 Feet Assistive device: Rolling walker (2 wheeled) Gait Pattern/deviations: Step-to pattern;Decreased step length - right;Decreased step length - left;Antalgic Gait velocity: decreased   General Gait Details: VCs positioning in RW, posture, and sequencing; no LOB, distance limited by pain   Stairs            Wheelchair Mobility    Modified Rankin (Stroke Patients Only)       Balance                                    Cognition Arousal/Alertness: Awake/alert Behavior During Therapy: WFL for tasks assessed/performed Overall Cognitive Status: Within Functional Limits for tasks assessed                      Exercises   Total Knee Replacement TE's 10 reps B LE ankle pumps 10 reps towel squeezes 10 reps knee presses 10 reps heel slides  10 reps SAQ's 10 reps SLR's 10 reps ABD Followed by ICE     General Comments        Pertinent Vitals/Pain Pain Assessment: 0-10 Pain Score: 5  Pain Location: R knee Pain Descriptors / Indicators: Discomfort;Operative site guarding Pain Intervention(s): Monitored during session;Repositioned;Ice applied    Home Living                      Prior Function            PT Goals (current goals can now be found in the care plan section) Progress towards PT goals: Progressing toward goals    Frequency           PT Plan Current plan remains appropriate    Co-evaluation             End of Session Equipment Utilized During Treatment: Gait belt;Right knee immobilizer Activity Tolerance: Patient tolerated treatment well Patient left: in chair;with call bell/phone within reach     Time: 0845-0910 PT Time Calculation (min) (ACUTE ONLY): 25 min  Charges:  $Gait Training: 8-22 mins $Therapeutic Exercise: 8-22 mins  G Codes:      Rica Koyanagi  PTA [facility]  Acute  Rehab Pager      [phone]

## 2016-07-27 NOTE — Discharge Summary (Signed)
Physician Discharge Summary   Patient ID: Jennifer Russell MRN: [id number] DOB/AGE: 1951-06-22 65 y.o.  Admit date: 07/26/2016 Discharge date: 07/28/2016  Primary Diagnosis:  Osteoarthritis  Right knee(s)  Admission Diagnoses:  Past Medical History:  Diagnosis Date  . Arthritis   . Headache    migraines  . Heart murmur    MVP  . Heartburn   . Hypertension   . Peripheral vascular disease (HCC)   . Pulmonary embolism (HCC) 05/1984   age 49  . Vasomotor rhinitis    Discharge Diagnoses:   Principal Problem:   OA (osteoarthritis) of knee  Estimated body mass index is 27.72 kg/m as calculated from the following:   Height as of this encounter: 5\' 7"  (1.702 m).   Weight as of this encounter: 80.3 kg (177 lb).  Procedure:  Procedure(s) (LRB): RIGHT TOTAL KNEE ARTHROPLASTY (Right)   Consults: None  HPI: Jennifer Russell is a 65 y.o. year old female with end stage OA of her right knee with progressively worsening pain and dysfunction. She has constant pain, with activity and at rest and significant functional deficits with difficulties even with ADLs. She has had extensive non-op management including analgesics, injections of cortisone and viscosupplements, and home exercise program, but remains in significant pain with significant dysfunction.Radiographs show bone on bone arthritis medial and patellofemoral. She presents now for right Total Knee Arthroplasty.    Laboratory Data: Admission on 07/26/2016  Component Date Value Ref Range Status  . WBC 07/27/2016 13.0* 4.0 - 10.5 K/uL Final  . RBC 07/27/2016 4.01  3.87 - 5.11 MIL/uL Final  . Hemoglobin 07/27/2016 12.2  12.0 - 15.0 g/dL Final  . HCT 07/29/2016 35.6* 36.0 - 46.0 % Final  . MCV 07/27/2016 88.8  78.0 - 100.0 fL Final  . MCH 07/27/2016 30.4  26.0 - 34.0 pg Final  . MCHC 07/27/2016 34.3  30.0 - 36.0 g/dL Final  . RDW 07/29/2016 12.8  11.5 - 15.5 % Final  . Platelets 07/27/2016 170  150 - 400 K/uL Final  . Sodium 07/27/2016 137   135 - 145 mmol/L Final  . Potassium 07/27/2016 4.4  3.5 - 5.1 mmol/L Final  . Chloride 07/27/2016 106  101 - 111 mmol/L Final  . CO2 07/27/2016 24  22 - 32 mmol/L Final  . Glucose, Bld 07/27/2016 123* 65 - 99 mg/dL Final  . BUN 07/29/2016 15  6 - 20 mg/dL Final  . Creatinine, Ser 07/27/2016 0.83  0.44 - 1.00 mg/dL Final  . Calcium 07/29/2016 8.9  8.9 - 10.3 mg/dL Final  . GFR calc non Af Amer 07/27/2016 >60  >60 mL/min Final  . GFR calc Af Amer 07/27/2016 >60  >60 mL/min Final   Comment: (NOTE) The eGFR has been calculated using the CKD EPI equation. This calculation has not been validated in all clinical situations. eGFR's persistently <60 mL/min signify possible Chronic Kidney Disease.   07/29/2016 gap 07/27/2016 7  5 - 15 Final  Hospital Outpatient Visit on 07/20/2016  Component Date Value Ref Range Status  . aPTT 07/20/2016 27  24 - 36 seconds Final  . WBC 07/20/2016 5.1  4.0 - 10.5 K/uL Final  . RBC 07/20/2016 4.68  3.87 - 5.11 MIL/uL Final  . Hemoglobin 07/20/2016 14.4  12.0 - 15.0 g/dL Final  . HCT 14/07/2016 43.3  36.0 - 46.0 % Final  . MCV 07/20/2016 92.5  78.0 - 100.0 fL Final  . MCH 07/20/2016 30.8  26.0 - 34.0 pg Final  .  MCHC 07/20/2016 33.3  30.0 - 36.0 g/dL Final  . RDW 07/20/2016 13.2  11.5 - 15.5 % Final  . Platelets 07/20/2016 180  150 - 400 K/uL Final  . Sodium 07/20/2016 139  135 - 145 mmol/L Final  . Potassium 07/20/2016 4.2  3.5 - 5.1 mmol/L Final  . Chloride 07/20/2016 106  101 - 111 mmol/L Final  . CO2 07/20/2016 26  22 - 32 mmol/L Final  . Glucose, Bld 07/20/2016 92  65 - 99 mg/dL Final  . BUN 07/20/2016 25* 6 - 20 mg/dL Final  . Creatinine, Ser 07/20/2016 0.87  0.44 - 1.00 mg/dL Final  . Calcium 07/20/2016 9.2  8.9 - 10.3 mg/dL Final  . Total Protein 07/20/2016 6.7  6.5 - 8.1 g/dL Final  . Albumin 07/20/2016 4.4  3.5 - 5.0 g/dL Final  . AST 07/20/2016 26  15 - 41 U/L Final  . ALT 07/20/2016 29  14 - 54 U/L Final  . Alkaline Phosphatase 07/20/2016 69   38 - 126 U/L Final  . Total Bilirubin 07/20/2016 1.2  0.3 - 1.2 mg/dL Final  . GFR calc non Af Amer 07/20/2016 >60  >60 mL/min Final  . GFR calc Af Amer 07/20/2016 >60  >60 mL/min Final   Comment: (NOTE) The eGFR has been calculated using the CKD EPI equation. This calculation has not been validated in all clinical situations. eGFR's persistently <60 mL/min signify possible Chronic Kidney Disease.   . Anion gap 07/20/2016 7  5 - 15 Final  . Prothrombin Time 07/20/2016 13.3  11.4 - 15.2 seconds Final  . INR 07/20/2016 1.01   Final  . ABO/RH(D) 07/26/2016 A POS   Final  . Antibody Screen 07/26/2016 NEG   Final  . Sample Expiration 07/26/2016 07/29/2016   Final  . Extend sample reason 07/26/2016 NO TRANSFUSIONS OR PREGNANCY IN THE PAST 3 MONTHS   Final  . MRSA, PCR 07/20/2016 NEGATIVE  NEGATIVE Final  . Staphylococcus aureus 07/20/2016 NEGATIVE  NEGATIVE Final   Comment:        The Xpert SA Assay (FDA approved for NASAL specimens in patients over 74 years of age), is one component of a comprehensive surveillance program.  Test performance has been validated by [facility] for patients greater than or equal to 63 year old. It is not intended to diagnose infection nor to guide or monitor treatment.   . Color, Urine 07/20/2016 YELLOW  YELLOW Final  . APPearance 07/20/2016 CLEAR  CLEAR Final  . Specific Gravity, Urine 07/20/2016 1.015  1.005 - 1.030 Final  . pH 07/20/2016 6.0  5.0 - 8.0 Final  . Glucose, UA 07/20/2016 NEGATIVE  NEGATIVE mg/dL Final  . Hgb urine dipstick 07/20/2016 NEGATIVE  NEGATIVE Final  . Bilirubin Urine 07/20/2016 NEGATIVE  NEGATIVE Final  . Ketones, ur 07/20/2016 NEGATIVE  NEGATIVE mg/dL Final  . Protein, ur 07/20/2016 NEGATIVE  NEGATIVE mg/dL Final  . Nitrite 07/20/2016 NEGATIVE  NEGATIVE Final  . Leukocytes, UA 07/20/2016 NEGATIVE  NEGATIVE Final  . ABO/RH(D) 07/20/2016 A POS   Final     X-Rays:No results found.  EKG:No orders found for this or any  previous visit.   Hospital Course: Jennifer Russell is a 65 y.o. who was admitted to [facility]. They were brought to the operating room on 07/26/2016 and underwent Procedure(s): RIGHT TOTAL KNEE ARTHROPLASTY.  Patient tolerated the procedure well and was later transferred to the recovery room and then to the orthopaedic floor for postoperative care.  They  were given PO and IV analgesics for pain control following their surgery.  They were given 24 hours of postoperative antibiotics of  Anti-infectives    Start     Dose/Rate Route Frequency Ordered Stop   07/26/16 1400  ceFAZolin (ANCEF) IVPB 2g/100 mL premix     2 g 200 mL/hr over 30 Minutes Intravenous Every 6 hours 07/26/16 0955 07/26/16 2052   07/26/16 0512  ceFAZolin (ANCEF) IVPB 2g/100 mL premix     2 g 200 mL/hr over 30 Minutes Intravenous On call to O.R. 07/26/16 3710 07/26/16 0734     and started on DVT prophylaxis in the form of Xarelto.   PT and OT were ordered for total joint protocol.  Discharge planning consulted to help with postop disposition and equipment needs.  Patient had a decent night on the evening of surgery.  They started to get up OOB with therapy on day one. Hemovac drain was pulled without difficulty.  Continued to work with therapy into day two.  Dressing was changed on day two and the incision was healing well.    Patient was seen in rounds and was ready to go home.   Discharge home with home health Diet - Cardiac diet Follow up - in 2 weeks Activity - WBAT Disposition - Home Condition Upon Discharge - Good D/C Meds - See DC Summary DVT Prophylaxis - Xarelto  Discharge Instructions    Call MD / Call 911    Complete by:  As directed    If you experience chest pain or shortness of breath, CALL 911 and be transported to the hospital emergency room.  If you develope a fever above 101 F, pus (white drainage) or increased drainage or redness at the wound, or calf pain, call your surgeon's office.   Change  dressing    Complete by:  As directed    Change dressing daily with sterile 4 x 4 inch gauze dressing and apply TED hose. Do not submerge the incision under water.   Constipation Prevention    Complete by:  As directed    Drink plenty of fluids.  Prune juice may be helpful.  You may use a stool softener, such as Colace (over the counter) 100 mg twice a day.  Use MiraLax (over the counter) for constipation as needed.   Diet - low sodium heart healthy    Complete by:  As directed    Discharge instructions    Complete by:  As directed    Pick up stool softner and laxative for home use following surgery while on pain medications. Do not submerge incision under water. Please use good hand washing techniques while changing dressing each day. May shower starting three days after surgery. Please use a clean towel to pat the incision dry following showers. Continue to use ice for pain and swelling after surgery. Do not use any lotions or creams on the incision until instructed by your surgeon.   Postoperative Constipation Protocol  Constipation - defined medically as fewer than three stools per week and severe constipation as less than one stool per week.  One of the most common issues patients have following surgery is constipation.  Even if you have a regular bowel pattern at home, your normal regimen is likely to be disrupted due to multiple reasons following surgery.  Combination of anesthesia, postoperative narcotics, change in appetite and fluid intake all can affect your bowels.  In order to avoid complications following surgery, here are some recommendations in  order to help you during your recovery period.  Colace (docusate) - Pick up an over-the-counter form of Colace or another stool softener and take twice a day as long as you are requiring postoperative pain medications.  Take with a full glass of water daily.  If you experience loose stools or diarrhea, hold the colace until you stool  forms back up.  If your symptoms do not get better within 1 week or if they get worse, check with your doctor.  Dulcolax (bisacodyl) - Pick up over-the-counter and take as directed by the product packaging as needed to assist with the movement of your bowels.  Take with a full glass of water.  Use this product as needed if not relieved by Colace only.   MiraLax (polyethylene glycol) - Pick up over-the-counter to have on hand.  MiraLax is a solution that will increase the amount of water in your bowels to assist with bowel movements.  Take as directed and can mix with a glass of water, juice, soda, coffee, or tea.  Take if you go more than two days without a movement. Do not use MiraLax more than once per day. Call your doctor if you are still constipated or irregular after using this medication for 7 days in a row.  If you continue to have problems with postoperative constipation, please contact the office for further assistance and recommendations.  If you experience "the worst abdominal pain ever" or develop nausea or vomiting, please contact the office immediatly for further recommendations for treatment.   Take Xarelto for two and a half more weeks, then discontinue Xarelto. Once the patient has completed the blood thinner regimen, then take a Baby 81 mg Aspirin daily for three more weeks.   Do not put a pillow under the knee. Place it under the heel.    Complete by:  As directed    Do not sit on low chairs, stoools or toilet seats, as it may be difficult to get up from low surfaces    Complete by:  As directed    Driving restrictions    Complete by:  As directed    No driving until released by the physician.   Increase activity slowly as tolerated    Complete by:  As directed    Lifting restrictions    Complete by:  As directed    No lifting until released by the physician.   Patient may shower    Complete by:  As directed    You may shower without a dressing once there is no drainage.   Do not wash over the wound.  If drainage remains, do not shower until drainage stops.   TED hose    Complete by:  As directed    Use stockings (TED hose) for 3 weeks on both leg(s).  You may remove them at night for sleeping.   Weight bearing as tolerated    Complete by:  As directed      Allergies as of 07/27/2016      Reactions   Hylan G-f 20 Other (See Comments)   synvisc injections, made her have body aches like the 'flu'      Medication List    STOP taking these medications   calcium carbonate 750 MG chewable tablet Commonly known as:  TUMS EX   conjugated estrogens vaginal cream Commonly known as:  PREMARIN     TAKE these medications   atenolol 100 MG tablet Commonly known as:  TENORMIN Take  100 mg by mouth 2 (two) times daily.   diazepam 5 MG tablet Commonly known as:  VALIUM Take 5 mg by mouth daily as needed for muscle spasms.   doxycycline 100 MG tablet Commonly known as:  VIBRA-TABS Take 100 mg by mouth daily as needed.   fluticasone 50 MCG/ACT nasal spray Commonly known as:  FLONASE Place 2 sprays into both nostrils daily.   ipratropium 0.06 % nasal spray Commonly known as:  ATROVENT Inhale 2 sprays into the lungs daily.   methocarbamol 500 MG tablet Commonly known as:  ROBAXIN Take 1 tablet (500 mg total) by mouth every 6 (six) hours as needed for muscle spasms.   oxyCODONE 5 MG immediate release tablet Commonly known as:  Oxy IR/ROXICODONE Take 1-2 tablets (5-10 mg total) by mouth every 3 (three) hours as needed for moderate pain or severe pain.   RELPAX 40 MG tablet Generic drug:  eletriptan Take 40 mg by mouth every 2 (two) hours as needed for migraine.   rivaroxaban 10 MG Tabs tablet Commonly known as:  XARELTO Take 1 tablet (10 mg total) by mouth daily with breakfast. Take Xarelto for two and a half more weeks, then discontinue Xarelto. Once the patient has completed the blood thinner regimen, then take a Baby 81 mg Aspirin daily for three  more weeks. Start taking on:  07/28/2016   traMADol 50 MG tablet Commonly known as:  ULTRAM Take 1-2 tablets (50-100 mg total) by mouth every 6 (six) hours as needed for moderate pain.   zolpidem 10 MG tablet Commonly known as:  AMBIEN Take 5 mg by mouth at bedtime as needed.      Follow-up Information    [facility] Follow up.   Specialty:  Home Health Services Why:  home health physical therapy Contact information: [address] [place] [address] [phone]        Gearlean Alf, MD. Schedule an appointment as soon as possible for a visit on 08/10/2016.   Specialty:  Orthopedic Surgery Contact information: [address] [place] [address] [phone]           Signed: Arlee Muslim, PA-C Orthopaedic Surgery 07/27/2016, 10:33 PM

## 2016-07-28 LAB — BASIC METABOLIC PANEL
Anion gap: 4 — ABNORMAL LOW (ref 5–15)
BUN: 21 mg/dL — AB (ref 6–20)
CALCIUM: 9 mg/dL (ref 8.9–10.3)
CO2: 30 mmol/L (ref 22–32)
CREATININE: 0.89 mg/dL (ref 0.44–1.00)
Chloride: 107 mmol/L (ref 101–111)
GFR calc Af Amer: >60 mL/min (ref >60)
GLUCOSE: 106 mg/dL — AB (ref 65–99)
Potassium: 4.4 mmol/L (ref 3.5–5.1)
SODIUM: 141 mmol/L (ref 135–145)

## 2016-07-28 LAB — CBC
HCT: 35.1 % — ABNORMAL LOW (ref 36.0–46.0)
Hemoglobin: 11.9 g/dL — ABNORMAL LOW (ref 12.0–15.0)
MCH: 31 pg (ref 26.0–34.0)
MCHC: 33.9 g/dL (ref 30.0–36.0)
MCV: 91.4 fL (ref 78.0–100.0)
PLATELETS: 170 10*3/uL (ref 150–400)
RBC: 3.84 MIL/uL — AB (ref 3.87–5.11)
RDW: 13.2 % (ref 11.5–15.5)
WBC: 12.6 10*3/uL — ABNORMAL HIGH (ref 4.0–10.5)

## 2016-07-28 MED FILL — XARELTO 10 MG TABLET: 10 | 19 days supply | Qty: 19 | Fill #0

## 2016-07-28 MED FILL — oxyCODONE HCL 5 MG TABS: 5 | 5 days supply | Qty: 80 | Fill #0

## 2016-07-28 MED FILL — METHOCARBAMOL 500 MG TABLET: 500 | 20 days supply | Qty: 80 | Fill #0

## 2016-07-28 MED FILL — traMADol HCL 50 MG TABS: 50 | 10 days supply | Qty: 80 | Fill #0

## 2016-07-28 NOTE — Progress Notes (Signed)
Pt d/c to home with HHPT. [facility] has been set up and pt aware that she will be receiving a call from them later this afternoon. Pt cleared by MD and PT to go home. Discharge instructions, prescriptions, reasons to return to MD/ED, and follow up appts reviewed with the pt and understanding confirmed with teachback method. PIV was removed overnight. Pt escorted off of unit via wheelchair to care and care of husband.

## 2016-07-28 NOTE — Progress Notes (Signed)
   Subjective: 2 Days Post-Op Procedure(s) (LRB): RIGHT TOTAL KNEE ARTHROPLASTY (Right) Patient reports pain as mild.   Patient seen in rounds with Dr. Lequita HaltAluisio. Patient is well, but has had some minor complaints of pain in the knee, requiring pain medications Patient is ready to go home  Objective: Vital signs in last 24 hours: Temp:  [97.9 F (36.6 C)-98.9 F (37.2 C)] 98.9 F (37.2 C) (12/19 2234) Pulse Rate:  [59-71] 71 (12/19 2234) Resp:  [16-17] 16 (12/19 2234) BP: (129-142)/(60-69) 136/63 (12/19 2234) SpO2:  [98 %-100 %] 100 % (12/19 2234)  Intake/Output from previous day:  Intake/Output Summary (Last 24 hours) at 07/28/16 0726 Last data filed at 07/28/16 0212  Gross per 24 hour  Intake             1260 ml  Output             2850 ml  Net            -1590 ml    Intake/Output this shift: No intake/output data recorded.  Labs:  Recent Labs  07/27/16 0411 07/28/16 0441  HGB 12.2 11.9*    Recent Labs  07/27/16 0411 07/28/16 0441  WBC 13.0* 12.6*  RBC 4.01 3.84*  HCT 35.6* 35.1*  PLT 170 170    Recent Labs  07/27/16 0411 07/28/16 0441  NA 137 141  K 4.4 4.4  CL 106 107  CO2 24 30  BUN 15 21*  CREATININE 0.83 0.89  GLUCOSE 123* 106*  CALCIUM 8.9 9.0   No results for input(s): LABPT, INR in the last 72 hours.  EXAM: General - Patient is Alert, Appropriate and Oriented Extremity - Neurovascular intact Sensation intact distally Intact pulses distally Dorsiflexion/Plantar flexion intact Incision - clean, dry, no drainage Motor Function - intact, moving foot and toes well on exam.   Assessment/Plan: 2 Days Post-Op Procedure(s) (LRB): RIGHT TOTAL KNEE ARTHROPLASTY (Right) Procedure(s) (LRB): RIGHT TOTAL KNEE ARTHROPLASTY (Right) Past Medical History:  Diagnosis Date  . Arthritis   . Headache    migraines  . Heart murmur    MVP  . Heartburn   . Hypertension   . Peripheral vascular disease (HCC)   . Pulmonary embolism (HCC) 05/1984   age [age]  . Vasomotor rhinitis    Principal Problem:   OA (osteoarthritis) of knee  Estimated body mass index is 27.72 kg/m as calculated from the following:   Height as of this encounter: 5\' 7"  (1.702 m).   Weight as of this encounter: 80.3 kg (177 lb). Up with therapy Discharge home with home health Diet - Cardiac diet Follow up - in 2 weeks Activity - WBAT Disposition - Home Condition Upon Discharge - Good D/C Meds - See DC Summary DVT Prophylaxis - Xarelto  Avel Peacerew Teriana Danker, PA-C Orthopaedic Surgery 07/28/2016, 7:26 AM

## 2016-07-28 NOTE — Progress Notes (Signed)
Physical Therapy Treatment Patient Details Name: Jennifer Russell MRN: [id number] DOB: Mar 27, 1951 Today's Date: 07/28/2016    History of Present Illness R TKA, h/o L TKA 7 yrs ago    PT Comments    POD # 2 Assisted with amb and practiced 2 one steps forward with walker Pt ready for D/C to home  Follow Up Recommendations  Home health PT     Equipment Recommendations       Recommendations for Other Services       Precautions / Restrictions Precautions Precautions: Knee Precaution Comments: instructed on KI use for amb and stairs and side sleeping Required Braces or Orthoses: Knee Immobilizer - Right Restrictions Weight Bearing Restrictions: No Other Position/Activity Restrictions: WBAT RLE    Mobility  Bed Mobility               General bed mobility comments: OOB in recliner  Transfers Overall transfer level: Needs assistance Equipment used: Rolling walker (2 wheeled) Transfers: Sit to/from UGI CorporationStand;Stand Pivot Transfers Sit to Stand: Supervision Stand pivot transfers: Supervision       General transfer comment: VCs hand placement  Ambulation/Gait Ambulation/Gait assistance: Supervision Ambulation Distance (Feet): 125 Feet Assistive device: Rolling walker (2 wheeled) Gait Pattern/deviations: Step-to pattern;Decreased step length - right;Decreased step length - left;Antalgic Gait velocity: decreased   General Gait Details: VCs positioning in RW, posture, and sequencing; no LOB, distance limited by pain   Stairs Stairs: Yes   Stair Management: No rails;Step to pattern;Forwards;With walker Number of Stairs: 1 General stair comments: one step forward with walker.  pt has 2 one steps to enter home  Wheelchair Mobility    Modified Rankin (Stroke Patients Only)       Balance                                    Cognition Arousal/Alertness: Awake/alert Behavior During Therapy: WFL for tasks assessed/performed Overall Cognitive Status:  Within Functional Limits for tasks assessed                      Exercises      General Comments        Pertinent Vitals/Pain Pain Assessment: 0-10 Pain Score: 4  Pain Location: R knee Pain Descriptors / Indicators: Discomfort;Operative site guarding Pain Intervention(s): Monitored during session;Repositioned;Ice applied    Home Living                      Prior Function            PT Goals (current goals can now be found in the care plan section) Progress towards PT goals: Progressing toward goals    Frequency    7X/week      PT Plan Current plan remains appropriate    Co-evaluation             End of Session Equipment Utilized During Treatment: Gait belt;Right knee immobilizer Activity Tolerance: Patient tolerated treatment well Patient left: in chair;with call bell/phone within reach     Time: 0950-1015 PT Time Calculation (min) (ACUTE ONLY): 25 min  Charges:  $Gait Training: 8-22 mins $Therapeutic Activity: 8-22 mins                    G Codes:      Felecia ShellingLori Flo Berroa  PTA [facility]  Acute  Rehab Pager      [phone]

## 2016-08-19 MED FILL — traMADol HCL 50 MG TABS: 50 | 10 days supply | Qty: 80 | Fill #1

## 2016-09-09 MED FILL — traMADol HCL 50 MG TABS: 50 | 7 days supply | Qty: 56 | Fill #0

## 2016-09-27 ENCOUNTER — Other Ambulatory Visit: Payer: Self-pay | Admitting: Family Medicine

## 2016-09-27 DIAGNOSIS — Z1231 Encounter for screening mammogram for malignant neoplasm of breast: Secondary | ICD-10-CM

## 2016-09-28 MED FILL — traMADol HCL 50 MG TABS: 50 | 7 days supply | Qty: 56 | Fill #0

## 2016-10-18 ENCOUNTER — Ambulatory Visit: Payer: BLUE CROSS/BLUE SHIELD

## 2016-11-05 ENCOUNTER — Ambulatory Visit
Admission: RE | Admit: 2016-11-05 | Discharge: 2016-11-05 | Disposition: A | Discharge status: Home / Self Care | Payer: BLUE CROSS/BLUE SHIELD | Source: Ambulatory Visit | Attending: Family Medicine | Admitting: Family Medicine

## 2016-11-05 DIAGNOSIS — Z1231 Encounter for screening mammogram for malignant neoplasm of breast: Secondary | ICD-10-CM

## 2017-09-27 ENCOUNTER — Other Ambulatory Visit: Payer: Self-pay | Admitting: Family Medicine

## 2017-09-27 DIAGNOSIS — Z1231 Encounter for screening mammogram for malignant neoplasm of breast: Secondary | ICD-10-CM

## 2017-11-11 ENCOUNTER — Ambulatory Visit
Admission: RE | Admit: 2017-11-11 | Discharge: 2017-11-11 | Disposition: A | Discharge status: Home / Self Care | Payer: BLUE CROSS/BLUE SHIELD | Source: Ambulatory Visit | Attending: Family Medicine | Admitting: Family Medicine

## 2017-11-11 DIAGNOSIS — Z1231 Encounter for screening mammogram for malignant neoplasm of breast: Secondary | ICD-10-CM

## 2018-03-31 DIAGNOSIS — I10 Essential (primary) hypertension: Secondary | ICD-10-CM | POA: Diagnosis not present

## 2018-03-31 DIAGNOSIS — Z0000 Encounter for general adult medical examination without abnormal findings: Secondary | ICD-10-CM | POA: Diagnosis not present

## 2018-03-31 DIAGNOSIS — G43909 Migraine, unspecified, not intractable, without status migrainosus: Secondary | ICD-10-CM | POA: Diagnosis not present

## 2018-03-31 DIAGNOSIS — A6000 Herpesviral infection of urogenital system, unspecified: Secondary | ICD-10-CM | POA: Diagnosis not present

## 2018-03-31 DIAGNOSIS — Z23 Encounter for immunization: Secondary | ICD-10-CM | POA: Diagnosis not present

## 2018-03-31 DIAGNOSIS — N952 Postmenopausal atrophic vaginitis: Secondary | ICD-10-CM | POA: Diagnosis not present

## 2018-03-31 DIAGNOSIS — G4700 Insomnia, unspecified: Secondary | ICD-10-CM | POA: Diagnosis not present

## 2018-03-31 DIAGNOSIS — J3089 Other allergic rhinitis: Secondary | ICD-10-CM | POA: Diagnosis not present

## 2018-04-04 DIAGNOSIS — M47817 Spondylosis without myelopathy or radiculopathy, lumbosacral region: Secondary | ICD-10-CM | POA: Diagnosis not present

## 2018-04-04 DIAGNOSIS — M791 Myalgia, unspecified site: Secondary | ICD-10-CM | POA: Diagnosis not present

## 2018-04-04 DIAGNOSIS — M62838 Other muscle spasm: Secondary | ICD-10-CM | POA: Diagnosis not present

## 2018-04-04 DIAGNOSIS — G894 Chronic pain syndrome: Secondary | ICD-10-CM | POA: Diagnosis not present

## 2018-04-13 DIAGNOSIS — L720 Epidermal cyst: Secondary | ICD-10-CM | POA: Diagnosis not present

## 2018-04-13 DIAGNOSIS — L718 Other rosacea: Secondary | ICD-10-CM | POA: Diagnosis not present

## 2018-05-01 DIAGNOSIS — Z23 Encounter for immunization: Secondary | ICD-10-CM | POA: Diagnosis not present

## 2018-05-16 DIAGNOSIS — M1612 Unilateral primary osteoarthritis, left hip: Secondary | ICD-10-CM | POA: Diagnosis not present

## 2018-05-17 DIAGNOSIS — M19072 Primary osteoarthritis, left ankle and foot: Secondary | ICD-10-CM | POA: Diagnosis not present

## 2018-05-25 DIAGNOSIS — M47817 Spondylosis without myelopathy or radiculopathy, lumbosacral region: Secondary | ICD-10-CM | POA: Diagnosis not present

## 2018-06-22 DIAGNOSIS — M1612 Unilateral primary osteoarthritis, left hip: Secondary | ICD-10-CM | POA: Diagnosis not present

## 2018-06-22 DIAGNOSIS — M7062 Trochanteric bursitis, left hip: Secondary | ICD-10-CM | POA: Diagnosis not present

## 2018-07-03 DIAGNOSIS — R300 Dysuria: Secondary | ICD-10-CM | POA: Diagnosis not present

## 2018-07-03 DIAGNOSIS — N3090 Cystitis, unspecified without hematuria: Secondary | ICD-10-CM | POA: Diagnosis not present

## 2018-08-11 DIAGNOSIS — M7062 Trochanteric bursitis, left hip: Secondary | ICD-10-CM | POA: Diagnosis not present

## 2018-08-11 DIAGNOSIS — M25552 Pain in left hip: Secondary | ICD-10-CM | POA: Diagnosis not present

## 2018-08-17 DIAGNOSIS — M25552 Pain in left hip: Secondary | ICD-10-CM | POA: Diagnosis not present

## 2018-08-22 DIAGNOSIS — M1612 Unilateral primary osteoarthritis, left hip: Secondary | ICD-10-CM | POA: Diagnosis not present

## 2018-08-22 DIAGNOSIS — M25552 Pain in left hip: Secondary | ICD-10-CM | POA: Diagnosis not present

## 2018-09-06 DIAGNOSIS — M1612 Unilateral primary osteoarthritis, left hip: Secondary | ICD-10-CM | POA: Diagnosis not present

## 2018-09-13 ENCOUNTER — Encounter: Payer: Self-pay | Admitting: Sports Medicine

## 2018-09-13 ENCOUNTER — Ambulatory Visit: Payer: Self-pay

## 2018-09-13 ENCOUNTER — Ambulatory Visit (INDEPENDENT_AMBULATORY_CARE_PROVIDER_SITE_OTHER): Payer: Medicare Other | Admitting: Sports Medicine

## 2018-09-13 VITALS — BP 118/76 | Ht 67.0 in | Wt 175.0 lb

## 2018-09-13 DIAGNOSIS — M7751 Other enthesopathy of right foot: Secondary | ICD-10-CM

## 2018-09-13 DIAGNOSIS — M79671 Pain in right foot: Secondary | ICD-10-CM | POA: Diagnosis not present

## 2018-09-13 MED ORDER — DICLOFENAC SODIUM 2 % TD SOLN: TRANSDERMAL | 2 refills | Status: DC

## 2018-09-13 NOTE — Progress Notes (Signed)
HPI  CC: Right heel pain  Jennifer Russell is a 68 year old female who presents for right heel pain.  She states the pain started several months ago.  She does not remember specific inciting event.  She does have an extensive history of trauma to this ankle.  She states she had a cut across her Achilles in high school, which required debridement by surgery.  She has chronic scar tissue over the area since that time.  She also had tarsal tunnel syndrome several years ago which required surgery.  Since that time, she has been in custom orthotics given to her by physical therapy.  She is also been doing home exercises including eccentric heel raises.  She is been taken Tylenol as needed for pain relief.  She describes the pain as a burning sensation in a C pattern around her entire heel and the medial and lateral side.  She states is worse in the first step in the morning.  She does not know if it improves throughout the day.  She denies any numbness and tingling in her foot.  She denies any weakness in her foot.  Past Injuries: Trauma to Achilles in high school, tarsal tunnel syndrome Past Surgeries: Tarsal tunnel release, debridement of the Achilles Smoking: Non-smoker Family Hx: Noncontributory  ROS: Per HPI; in addition no fever, no rash, no additional weakness, no additional numbness, no additional paresthesias, and no additional falls/injury.   All past medical history, medications, and allergies reviewed myself at today's visit.  Objective: BP 118/76   Ht 5\' 7"  (1.702 m)   Wt 175 lb (79.4 kg)   BMI 27.41 kg/m  Gen: right-Hand Dominant. NAD, well groomed, a/o x3, normal affect.  CV: Well-perfused. Warm.  Resp: Non-labored.  Neuro: Sensation intact throughout. No gross coordination deficits.  Gait: Nonpathologic posture, unremarkable stride without signs of limp or balance issues.  Right foot exam: No erythema, warmth, swelling noted.  Tenderness palpation noted over the retrocalcaneal bursa.   Scar over the Achilles, without signs of erythema or warmth.  Full range of motion dorsiflexion, plantarflexion, eversion, inversion.  Strength 5 5 throughout testing.  Negative anterior drawer.  Negative Tinel sign on the tibial nerve.  ULTRASOUND: Achilles tendon, right Diagnostic ultrasound obtained of patient's right Achilles tendon.  - Achilles tendon diameter: 0.88 cm - Calcaneal insertion point: Visualized in long and short axis.  There is evidence of healing scar tissue with calcification at the insertion site.  There are irregularities in the calcaneal bone over the insertion site. - Achilles tendon midportion: Visualized in long and short axis.  Evidence of thickening.  No signs of tearing, or calcification. No abnormal fluid presence.  - Bursa: Significant amount of retrocalcaneal bursal inflammation/fluid.  There is moderate amount of superficial calcaneal bursal inflammation/fluid. 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  Assessment and Plan: Right heel pain, with extensive history of procedures on the heel.  I believe this is secondary to a retrocalcaneal bursitis based on ultrasound findings as above.  We discussed treatment options at today's visit.  At this time I will place her in soft heel cups.  I will also start her on Voltaren cream over the area.  She should continue with eccentric exercises.  She should also continue the home regimen she was given a previous appointment.  She can take Tylenol as needed for pain.  This does not improve, I will consider trying a compressive sleeve over the ankle.  We will see her back  for follow-up in 4 weeks.  Alric Quan, MD [facility] Sports Medicine Fellow 09/13/2018 9:32 AM   I was the preceptor for this visit and available for immediate consultation Marsa Aris, DO

## 2018-09-13 NOTE — Patient Instructions (Signed)
Thank you for coming to see us today in clinic.  You were seen today for your right heel.  We will start you back on home exercises.  We will also advise you to start Voltaren cream over the affected area 2-4 times a day.  We will fit you for some soft heel cups today to see if getting improvement from this.  We will see back in 4 weeks for follow-up.

## 2018-09-14 ENCOUNTER — Encounter: Payer: Self-pay | Admitting: Sports Medicine

## 2018-09-14 MED ORDER — DICLOFENAC SODIUM 2 % TD SOLN: TRANSDERMAL | 2 refills | Status: DC

## 2018-09-14 NOTE — Addendum Note (Signed)
Addended by: Annita Brod on: 09/14/2018 03:36 PM   Modules accepted: Orders

## 2018-09-19 DIAGNOSIS — M79672 Pain in left foot: Secondary | ICD-10-CM | POA: Diagnosis not present

## 2018-09-19 DIAGNOSIS — M19072 Primary osteoarthritis, left ankle and foot: Secondary | ICD-10-CM | POA: Diagnosis not present

## 2018-09-29 DIAGNOSIS — N183 Chronic kidney disease, stage 3 (moderate): Secondary | ICD-10-CM | POA: Diagnosis not present

## 2018-09-29 DIAGNOSIS — N952 Postmenopausal atrophic vaginitis: Secondary | ICD-10-CM | POA: Diagnosis not present

## 2018-09-29 DIAGNOSIS — E785 Hyperlipidemia, unspecified: Secondary | ICD-10-CM | POA: Diagnosis not present

## 2018-09-29 DIAGNOSIS — G4700 Insomnia, unspecified: Secondary | ICD-10-CM | POA: Diagnosis not present

## 2018-09-29 DIAGNOSIS — I129 Hypertensive chronic kidney disease with stage 1 through stage 4 chronic kidney disease, or unspecified chronic kidney disease: Secondary | ICD-10-CM | POA: Diagnosis not present

## 2018-09-29 DIAGNOSIS — Z01818 Encounter for other preprocedural examination: Secondary | ICD-10-CM | POA: Diagnosis not present

## 2018-10-02 ENCOUNTER — Other Ambulatory Visit: Payer: Self-pay | Admitting: Family Medicine

## 2018-10-02 DIAGNOSIS — Z1231 Encounter for screening mammogram for malignant neoplasm of breast: Secondary | ICD-10-CM

## 2018-10-06 DIAGNOSIS — I129 Hypertensive chronic kidney disease with stage 1 through stage 4 chronic kidney disease, or unspecified chronic kidney disease: Secondary | ICD-10-CM | POA: Diagnosis not present

## 2018-10-06 DIAGNOSIS — E785 Hyperlipidemia, unspecified: Secondary | ICD-10-CM | POA: Diagnosis not present

## 2018-10-11 ENCOUNTER — Ambulatory Visit: Payer: Medicare Other | Admitting: Sports Medicine

## 2018-10-18 ENCOUNTER — Ambulatory Visit (INDEPENDENT_AMBULATORY_CARE_PROVIDER_SITE_OTHER): Payer: Medicare Other | Admitting: Sports Medicine

## 2018-10-18 ENCOUNTER — Other Ambulatory Visit: Payer: Self-pay

## 2018-10-18 VITALS — BP 124/82 | Ht 67.0 in | Wt 180.0 lb

## 2018-10-18 DIAGNOSIS — M7661 Achilles tendinitis, right leg: Secondary | ICD-10-CM | POA: Diagnosis not present

## 2018-10-18 DIAGNOSIS — M7751 Other enthesopathy of right foot: Secondary | ICD-10-CM | POA: Diagnosis not present

## 2018-10-18 MED ORDER — NITROGLYCERIN 0.2 MG/HR TD PT24: MEDICATED_PATCH | TRANSDERMAL | 1 refills | Status: DC

## 2018-10-18 NOTE — Patient Instructions (Signed)

## 2018-10-18 NOTE — Progress Notes (Signed)
   HPI  CC: Right heel pain  Jennifer Russell is a 68 year old female who presents for follow-up of right heel pain.  She was last seen on 09/13/2018.  At that time apparent soft heel cups.  Also started on Voltaren cream over the area.  She was started on eccentric exercises for the issue, which she has been doing every day.  She states none of this has helped.  She states she continues to have worsening heel pain on the right side.  She states the Voltaren cream does help somewhat and she applies it.  She denies any numbness and tingling into her foot.  She denies any weakness of the foot.  The pain is isolated on the posterior aspect on the medial side of the right foot.  The pain does bother her at nighttime sometimes.  She has no new trauma to the area.  See HPI and/or previous note for associated ROS.  Objective: BP 124/82   Ht 5\' 7"  (1.702 m)   Wt 180 lb (81.6 kg)   BMI 28.19 kg/m  Gen: Right-Hand Dominant. NAD, well groomed, a/o x3, normal affect.  CV: Well-perfused. Warm.  Resp: Non-labored.  Neuro: Sensation intact throughout. No gross coordination deficits.  Gait: Nonpathologic posture, unremarkable stride without signs of limp or balance issues.  Right foot exam: No erythema, warmth, swelling noted.  Tenderness palpation over the posterior heel, just anterior to the insertion of the Achilles tendon.  Full range of motion dorsiflexion, plantarflexion, eversion, inversion.  Pain with plantarflexion.  Negative anterior drawer.  Assessment and plan: Right heel pain,secondary to retrocalcaneal bursitis.  She has a longstanding history of procedures on this foot.  This includes Achilles scar debridement and tarsal tunnel surgery.  We discussed treatment options moving forward.  We will start her on Nitropatch protocol at this time.  We advised her to speak with her orthopedic surgeon who is doing her hip surgery in 3 weeks about when to stop this prior to surgery.  I have written her physical  therapy prescription today for rehab for retrocalcaneal bursitis.  She can give this to the physical therapy department working with her postoperatively from her hip.  I will see her back in 3 months for follow-up after her total hip.  If she does have resolution at that time for the Nitropatch, I will consider adding in a compressive ankle sleeve.  Alric Quan, MD [facility] Health Sports Medicine Fellow 10/18/2018 9:14 AM  I was the preceptor for this visit and available for immediate consultation Marsa Aris, DO

## 2018-10-19 ENCOUNTER — Encounter: Payer: Self-pay | Admitting: Sports Medicine

## 2018-10-23 ENCOUNTER — Encounter (HOSPITAL_COMMUNITY): Payer: Self-pay | Admitting: Student

## 2018-10-23 NOTE — H&P (Signed)
TOTAL HIP ADMISSION H&P  Patient is admitted for left total hip arthroplasty.  Subjective:  Chief Complaint: left hip pain  HPI: Genella Mech, 68 y.o. female, has a history of pain and functional disability in the left hip(s) due to arthritis and patient has failed non-surgical conservative treatments for greater than 12 weeks to include corticosteriod injections and activity modification.  Onset of symptoms was gradual starting 4 years ago with gradually worsening course since that time.The patient noted no past surgery on the left hip(s).  Patient currently rates pain in the left hip at 1 out of 10 with activity. Patient has worsening of pain with activity and weight bearing. Patient has evidence of progressive arthritis compared to previous scan. She has marginal osteophyte formation and significant narrowing to the point where she is close to bone-on-bone. by imaging studies. This condition presents safety issues increasing the risk of falls.  There is no current active infection.  Patient Active Problem List   Diagnosis Date Noted  . OA (osteoarthritis) of knee 08/23/2011  . Metatarsalgia 08/23/2011  . Foot pain, left 07/22/2011   Past Medical History:  Diagnosis Date  . Arthritis   . Headache    migraines  . Heart murmur    MVP  . Heartburn   . Hypertension   . Peripheral vascular disease (HCC)   . Pulmonary embolism (HCC) 05/1984   age 37  . Vasomotor rhinitis     Past Surgical History:  Procedure Laterality Date  . ABDOMINAL HYSTERECTOMY    . JOINT REPLACEMENT Left    knee  . TOTAL KNEE ARTHROPLASTY Right 07/26/2016   Procedure: RIGHT TOTAL KNEE ARTHROPLASTY;  Surgeon: Ollen Gross, MD;  Location: [facility];  Service: Orthopedics;  Laterality: Right;    No current facility-administered medications for this encounter.    Current Outpatient Medications  Medication Sig Dispense Refill Last Dose  . atenolol (TENORMIN) 100 MG tablet Take 100 mg by mouth 2 (two) times daily.     Not Taking  . Diclofenac Sodium (PENNSAID) 2 % SOLN Apply 2 pumps BID to the affected area 112 g 2 Taking  . fluticasone (FLONASE) 50 MCG/ACT nasal spray Place 2 sprays into both nostrils daily.   Taking  . ipratropium (ATROVENT) 0.06 % nasal spray Inhale 2 sprays into the lungs daily.    Taking  . metoprolol succinate (TOPROL-XL) 25 MG 24 hr tablet    Taking  . nitroGLYCERIN (NITRODUR - DOSED IN MG/24 HR) 0.2 mg/hr patch Place 1/4 patch to the affected area daily. 30 patch 1   . RELPAX 40 MG tablet Take 40 mg by mouth every 2 (two) hours as needed for migraine.    Taking  . zolpidem (AMBIEN) 10 MG tablet Take 5 mg by mouth at bedtime as needed.    Taking   Allergies  Allergen Reactions  . Hylan G-F 20 Other (See Comments)    synvisc injections, made her have body aches like the 'flu'    Social History   Tobacco Use  . Smoking status: Never Smoker  . Smokeless tobacco: Never Used  Substance Use Topics  . Alcohol use: Yes    Comment: daily glass of wine    No family history on file.   ROS Constitutional: Constitutional: no fever, chills, night sweats, or significant weight loss. Cardiovascular: Cardiovascular: no palpitations or chest pain. Respiratory: Respiratory: no cough or shortness of breath and No COPD. Gastrointestinal: Gastrointestinal: no vomiting or nausea. Musculoskeletal: Musculoskeletal: no swelling in Joints and  Joint Pain. Neurologic: Neurologic: no numbness, tingling, or difficulty with balance.  Objective:  Physical Exam Patient is a 68 year old female. Well nourished and well developed. General: Alert and oriented x3, cooperative and pleasant, no acute distress. Head: normocephalic, atraumatic, neck supple. Eyes: EOMI. Respiratory: breath sounds clear in all fields, no wheezing, rales, or rhonchi. Cardiovascular: Regular rate and rhythm, no murmurs, gallops or rubs. Abdomen: non-tender to palpation and soft, normoactive bowel sounds.  Musculoskeletal:  Left Hip Exam: ROM: Flexion to 120, Internal Rotation 30, External Rotation 40, and abduction 40 with pain. There is no tenderness over the greater trochanter bursa. There is no pain on provocative testing of the hip.  Calves soft and nontender. Motor function intact in LE. Strength 5/5 LE bilaterally.  Neuro: Distal pulses 2+. Sensation to light touch intact in LE.  Vital signs in last 24 hours:  Labs:   Estimated body mass index is 28.19 kg/m as calculated from the following:   Height as of 10/18/18: 5\' 7"  (1.702 m).   Weight as of 10/18/18: 81.6 kg.   Imaging Review Plain radiographs demonstrate severe degenerative joint disease of the left hip(s). The bone quality appears to be adequate for age and reported activity level.      Assessment/Plan:  End stage arthritis, left hip(s)  The patient history, physical examination, clinical judgement of the provider and imaging studies are consistent with end stage degenerative joint disease of the left hip(s) and total hip arthroplasty is deemed medically necessary. The treatment options including medical management, injection therapy, arthroscopy and arthroplasty were discussed at length. The risks and benefits of total hip arthroplasty were presented and reviewed. The risks due to aseptic loosening, infection, stiffness, dislocation/subluxation,  thromboembolic complications and other imponderables were discussed.  The patient acknowledged the explanation, agreed to proceed with the plan and consent was signed. Patient is being admitted for inpatient treatment for surgery, pain control, PT, OT, prophylactic antibiotics, VTE prophylaxis, progressive ambulation and ADL's and discharge planning.The patient is planning to be discharged home.  Therapy Plans: HEP Disposition: Home with husband Planned DVT Prophylaxis: Xarelto 10mg  daily (hx PE) DME needed: none PCP: Dr. Laurann Montana TXA: topical Allergies: none Anesthesia Concerns: none  BMI: 28.6  Other: Hx of PE after surgery in 1985. Stage 3 CKD.   - Patient was instructed on what medications to stop prior to surgery. - Follow-up visit in 2 weeks with Dr. Lequita Halt - Begin physical therapy following surgery - Pre-operative lab work as pre-surgical testing - Prescriptions will be provided in hospital at time of discharge  Dennie Bible, PA-C Orthopedic Surgery [facility] [place]

## 2018-11-15 ENCOUNTER — Encounter (HOSPITAL_COMMUNITY): Admission: RE | Payer: Self-pay | Source: Home / Self Care

## 2018-11-15 ENCOUNTER — Inpatient Hospital Stay (HOSPITAL_COMMUNITY)
Admission: RE | Admit: 2018-11-15 | Payer: BLUE CROSS/BLUE SHIELD | Source: Home / Self Care | Admitting: Orthopedic Surgery

## 2018-11-15 SURGERY — ARTHROPLASTY, HIP, TOTAL, ANTERIOR APPROACH
Anesthesia: Choice | Laterality: Left

## 2018-12-05 ENCOUNTER — Ambulatory Visit (INDEPENDENT_AMBULATORY_CARE_PROVIDER_SITE_OTHER): Payer: Medicare Other | Admitting: Sports Medicine

## 2018-12-05 ENCOUNTER — Other Ambulatory Visit: Payer: Self-pay

## 2018-12-05 ENCOUNTER — Encounter: Payer: Self-pay | Admitting: Sports Medicine

## 2018-12-05 ENCOUNTER — Ambulatory Visit: Payer: Self-pay

## 2018-12-05 VITALS — BP 148/83 | Ht 67.0 in | Wt 180.0 lb

## 2018-12-05 DIAGNOSIS — M79671 Pain in right foot: Secondary | ICD-10-CM

## 2018-12-05 NOTE — Progress Notes (Signed)
   HPI  CC: Right ankle pain  Jennifer Russell is a 68 year old female presents for follow-up of right ankle pain.  She is been dealing with this for several months at this time.  She is tried soft heel cups, Voltaren cream, Nitropatch protocol, and eccentric exercises.  None of these interventions have helped the pain.  She continues to have pain at the posterior of her heel, just behind the Achilles tendon.  Recall, she had an ultrasound performed at last visit which showed a retrocalcaneal bursitis.  She has never had an injection this area.  She denies any numbness or tingling in her foot still.  She denies any weakness of her foot.  She denies any swelling to foot.  See HPI and/or previous note for associated ROS.  Objective: BP (!) 148/83   Ht 5\' 7"  (1.702 m)   Wt 180 lb (81.6 kg)   BMI 28.19 kg/m  Gen: NAD, well groomed, a/o x3, normal affect.  CV: Well-perfused. Warm.  Resp: Non-labored.  Neuro: Sensation intact throughout. No gross coordination deficits.  Gait: Nonpathologic posture, unremarkable stride without signs of limp or balance issues.  Right ankle exam: No erythema, warmth, swelling noted.  Tenderness palpation over the retrocalcaneal bursa right foot.  Full range of motion plantar flexion, dorsiflexion, eversion, inversion.  Strength 5 5 throughout testing.  Negative squeeze test, negative anterior drawer test.  Assessment and plan: Retrocalcaneal bursitis, right foot.  We discussed treatment options at today's visit.  I did offer her an injection to the retrocalcaneal bursa, the patient wishes to withhold from this at this time.  We have tried conservative therapy with physical therapy and eccentric exercises.  We have also been using Nitropatch protocol for the last 6 weeks.  She has not got any benefit from this.  At this time we will refer her to Dr. Susa Simmonds over [facility] orthopedics for foot and ankle specialty.  We will follow her up after this appointment.  Alric Quan, MD  [facility] Sports Medicine Fellow 12/05/2018 3:08 PM   I was the preceptor for this visit and available for immediate consultation Marsa Aris, DO

## 2018-12-05 NOTE — Patient Instructions (Addendum)
[facility] Jennifer Guadalajara, MD [address] [place], [place] [address] Phone: [phone]  Wednesday April 29th at 2pm Arrival time is 130p  *Come alone  *Wear a mask

## 2018-12-06 ENCOUNTER — Ambulatory Visit: Payer: Medicare Other

## 2018-12-06 DIAGNOSIS — M722 Plantar fascial fibromatosis: Secondary | ICD-10-CM | POA: Diagnosis not present

## 2018-12-13 NOTE — H&P (Addendum)
TOTAL HIP ADMISSION H&P  Patient is admitted for left total hip arthroplasty.  Subjective:  Chief Complaint: left hip pain  HPI: Jennifer Russell, 68 y.o. female, has a history of pain and functional disability in the left hip(s) due to arthritis and patient has failed non-surgical conservative treatments for greater than 12 weeks to include corticosteriod injections and activity modification.  Onset of symptoms was gradual starting 2 years ago with gradually worsening course since that time.The patient noted no past surgery on the left hip(s).  Patient currently rates pain in the left hip at 7 out of 10 with activity. Patient has worsening of pain with activity and weight bearing and pain that interfers with activities of daily living. Patient has evidence of  progressive arthritis compared to previous scan. She has marginal osteophyte formation and significant narrowing to the point where she is close to bone-on-bone. by imaging studies. This condition presents safety issues increasing the risk of falls.  There is no current active infection.  Patient Active Problem List   Diagnosis Date Noted  . OA (osteoarthritis) of knee 08/23/2011  . Metatarsalgia 08/23/2011  . Foot pain, left 07/22/2011   Past Medical History:  Diagnosis Date  . Arthritis   . Headache    migraines  . Heart murmur    MVP  . Heartburn   . Hypertension   . Peripheral vascular disease (HCC)   . Pulmonary embolism (HCC) 05/1984   age [age]  . Vasomotor rhinitis     Past Surgical History:  Procedure Laterality Date  . ABDOMINAL HYSTERECTOMY    . JOINT REPLACEMENT Left    knee  . TOTAL KNEE ARTHROPLASTY Right 07/26/2016   Procedure: RIGHT TOTAL KNEE ARTHROPLASTY;  Surgeon: Ollen GrossFrank Aluisio, MD;  Location: [facility];  Service: Orthopedics;  Laterality: Right;       Current Outpatient Medications  Medication Sig Dispense Refill Last Dose  . atenolol (TENORMIN) 100 MG tablet Take 100 mg by mouth 2 (two) times daily.     Not Taking  . Diclofenac Sodium (PENNSAID) 2 % SOLN Apply 2 pumps BID to the affected area 112 g 2 Taking  . fluticasone (FLONASE) 50 MCG/ACT nasal spray Place 2 sprays into both nostrils daily.   Taking  . ipratropium (ATROVENT) 0.06 % nasal spray Inhale 2 sprays into the lungs daily.    Taking  . metoprolol succinate (TOPROL-XL) 25 MG 24 hr tablet    Taking  . nitroGLYCERIN (NITRODUR - DOSED IN MG/24 HR) 0.2 mg/hr patch Place 1/4 patch to the affected area daily. 30 patch 1   . RELPAX 40 MG tablet Take 40 mg by mouth every 2 (two) hours as needed for migraine.    Taking  . zolpidem (AMBIEN) 10 MG tablet Take 5 mg by mouth at bedtime as needed.    Taking   Allergies  Allergen Reactions  . Hylan G-F 20 Other (See Comments)    synvisc injections, made her have body aches like the 'flu'    Social History   Tobacco Use  . Smoking status: Never Smoker  . Smokeless tobacco: Never Used  Substance Use Topics  . Alcohol use: Yes    Comment: daily glass of wine   No pertinent family medical history  Review of Systems  Constitutional: Negative.   HENT: Negative.   Eyes: Negative.   Respiratory: Negative.   Cardiovascular: Negative.   Gastrointestinal: Negative.   Genitourinary: Negative.   Musculoskeletal: Positive for joint pain and myalgias. Negative for  back pain, falls and neck pain.  Skin: Negative.   Neurological: Negative.   Endo/Heme/Allergies: Negative.   Psychiatric/Behavioral: Negative.      Objective:  Physical Exam  Constitutional: She is oriented to person, place, and time. She appears well-developed and well-nourished. No distress.  HENT:  Head: Normocephalic and atraumatic.  Right Ear: External ear normal.  Left Ear: External ear normal.  Nose: Nose normal.  Mouth/Throat: Oropharynx is clear and moist.  Eyes: Conjunctivae and EOM are normal.  Neck: Normal range of motion. Neck supple.  Cardiovascular: Normal rate, regular rhythm, normal heart sounds and  intact distal pulses.  No murmur heard. Respiratory: Effort normal and breath sounds normal. No respiratory distress. She has no wheezes.  GI: Soft. Bowel sounds are normal. She exhibits no distension. There is no abdominal tenderness.  Musculoskeletal:     Right hip: Normal.     Right knee: Normal.     Left knee: Normal.     Comments: Left Hip Exam: ROM: Flexion to 120, Internal Rotation 30, External Rotation 40, and abduction 40 with pain. There is no tenderness over the greater trochanter bursa. There is no pain on provocative testing of the hip.  Neurological: She is alert and oriented to person, place, and time. She has normal strength. No sensory deficit.  Skin: No rash noted. She is not diaphoretic. No erythema.  Psychiatric: She has a normal mood and affect. Her behavior is normal.    Vital signs BP: 140/76 sitting L arm  Pulse: 64 bpm regular    Estimated body mass index is 28.19 kg/m as calculated from the following:   Height as of 12/05/18: 5\' 7"  (1.702 m).   Weight as of 12/05/18: 81.6 kg.   Imaging Review Plain radiographs demonstrate severe degenerative joint disease of the left hip(s). The bone quality appears to be adequate for age and reported activity level.    Assessment/Plan:  End stage primary osteoarthritis, left hip(s)  The patient history, physical examination, clinical judgement of the provider and imaging studies are consistent with end stage degenerative joint disease of the left hip(s) and total hip arthroplasty is deemed medically necessary. The treatment options including medical management, injection therapy, arthroscopy and arthroplasty were discussed at length. The risks and benefits of total hip arthroplasty were presented and reviewed. The risks due to aseptic loosening, infection, stiffness, dislocation/subluxation,  thromboembolic complications and other imponderables were discussed.  The patient acknowledged the explanation, agreed to proceed  with the plan and consent was signed. Patient is being admitted for inpatient treatment for surgery, pain control, PT, OT, prophylactic antibiotics, VTE prophylaxis, progressive ambulation and ADL's and discharge planning.The patient is planning to be discharged home.  Therapy Plans: HEP Disposition: Home with husband Planned DVT Prophylaxis: Xarelto 10mg  daily (hx PE) DME needed: none PCP: Dr. Laurann Montana TXA: topical Allergies: none Anesthesia Concerns: none Wants spinal BMI: 27.7  Other: Hx of PE after surgery in 1985. Stage 3 CKD.   Patient has tramadol, Reports that she does not need additional RX.  - Patient was instructed on what medications to stop prior to surgery. - Follow-up visit in 2 weeks with Dr. Lequita Halt - Begin physical therapy following surgery - Pre-operative lab work as pre-surgical testing - Prescriptions will be provided in hospital at time of discharge  Dimitri Ped, PA-C Orthopedic Surgery [facility] [place]

## 2018-12-14 NOTE — Progress Notes (Signed)
SPOKE W/ PT_     SCREENING SYMPTOMS OF COVID 19:   COUGH--NO  SORE THROAT---NO  NASAL CONGESTION----NO  SNEEZING----NO  SHORTNESS OF BREATH---NO  DIFFICULTY BREATHING---NO  TEMP >100.0 -----NO  UNEXPLAINED BODY ACHES------NO  CHILLS -------- NO  HEADACHES ---------NO  LOSS OF SMELL/ TASTE --------NO    HAVE YOU OR ANY FAMILY MEMBER TRAVELLED PAST 14 DAYS OUT OF THE   COUNTY---NO STATE----NO COUNTRY----NO  HAVE YOU OR ANY FAMILY MEMBER BEEN EXPOSED TO ANYONE WITH COVID 19? NO

## 2018-12-14 NOTE — Patient Instructions (Addendum)
Jennifer Russell  12/14/2018   Your procedure is scheduled on: 12-20-18     Report to [facility] Main  Entrance    Report to Admitting at 8:00 AM    Call this number if you have problems the morning of surgery [phone]    Remember: NO SOLID FOOD AFTER MIDNIGHT THE NIGHT PRIOR TO SURGERY. NOTHING BY MOUTH EXCEPT CLEAR LIQUIDS.  PLEASE FINISH ENSURE DRINK PER SURGEON ORDER,  THIS DRINK NEEDS TO BE COMPLETED BY 4:30 AM.   CLEAR LIQUID DIET   Foods Allowed                                                                     Foods Excluded  Coffee and tea, regular and decaf                             liquids that you cannot  Plain Jell-O in any flavor                                             see through such as: Fruit ices (not with fruit pulp)                                     milk, soups, orange juice  Iced Popsicles                                    All solid food Carbonated beverages, regular and diet                                    Cranberry, grape and apple juices Sports drinks like Gatorade Lightly seasoned clear broth or consume(fat free) Sugar, honey syrup  Sample Menu Breakfast                                Lunch                                     Supper Cranberry juice                    Beef broth                            Chicken broth Jell-O                                     Grape juice  Apple juice Coffee or tea                        Jell-O                                      Popsicle                                                Coffee or tea                        Coffee or tea  _____________________________________________________________________     BRUSH YOUR TEETH MORNING OF SURGERY AND RINSE YOUR MOUTH OUT, NO CHEWING GUM CANDY OR MINTS.     Take these medicines the morning of surgery with A SIP OF WATER: Metoprolol Succinate (Toprol-XL)                                 You may not  have any metal on your body including hair pins and              piercings  Do not wear jewelry, make-up, lotions, powders or perfumes, deodorant             Do not wear nail polish.  Do not shave  48 hours prior to surgery.     Do not bring valuables to the hospital.  IS NOT             RESPONSIBLE   FOR VALUABLES.  Contacts, dentures or bridgework may not be worn into surgery.  Leave suitcase in the car. After surgery it may be brought to your room.    Special Instructions: N/A              Please read over the following fact sheets you were given: _____________________________________________________________________             [facility] Pre[facility] Before surgery, you can play an important role.  Because skin is not sterile, your skin needs to be as free of germs as possible.  You can reduce the number of germs on your skin by washing with CHG (chlorahexidine gluconate) soap before surgery.  CHG is an antiseptic cleaner which kills germs and bonds with the skin to continue killing germs even after washing. Please DO NOT use if you have an allergy to CHG or antibacterial soaps.  If your skin becomes reddened/irritated stop using the CHG and inform your nurse when you arrive at Short Stay. Do not shave (including legs and underarms) for at least 48 hours prior to the first CHG shower.  You may shave your face/neck. Please follow these instructions carefully:  1.  Shower with CHG Soap the night before surgery and the  morning of Surgery.  2.  If you choose to wash your hair, wash your hair first as usual with your  normal  shampoo.  3.  After you shampoo, rinse your hair and body thoroughly to remove the  shampoo.  4.  Use CHG as you would any other liquid soap.  You can apply chg directly  to the skin and wash                       Gently with a scrungie or clean washcloth.  5.  Apply the CHG Soap to your body ONLY FROM THE NECK DOWN.    Do not use on face/ open                           Wound or open sores. Avoid contact with eyes, ears mouth and genitals (private parts).                       Wash face,  Genitals (private parts) with your normal soap.             6.  Wash thoroughly, paying special attention to the area where your surgery  will be performed.  7.  Thoroughly rinse your body with warm water from the neck down.  8.  DO NOT shower/wash with your normal soap after using and rinsing off  the CHG Soap.                9.  Pat yourself dry with a clean towel.            10.  Wear clean pajamas.            11.  Place clean sheets on your bed the night of your first shower and do not  sleep with pets. Day of Surgery : Do not apply any lotions/deodorants the morning of surgery.  Please wear clean clothes to the hospital/surgery center.  FAILURE TO FOLLOW THESE INSTRUCTIONS MAY RESULT IN THE CANCELLATION OF YOUR SURGERY PATIENT SIGNATURE_________________________________  NURSE SIGNATURE__________________________________  ________________________________________________________________________   Adam Phenix  An incentive spirometer is a tool that can help keep your lungs clear and active. This tool measures how well you are filling your lungs with each breath. Taking long deep breaths may help reverse or decrease the chance of developing breathing (pulmonary) problems (especially infection) following:  A long period of time when you are unable to move or be active. BEFORE THE PROCEDURE   If the spirometer includes an indicator to show your best effort, your nurse or respiratory therapist will set it to a desired goal.  If possible, sit up straight or lean slightly forward. Try not to slouch.  Hold the incentive spirometer in an upright position. INSTRUCTIONS FOR USE  1. Sit on the edge of your bed if possible, or sit up as far as you can in bed or on a chair. 2. Hold the incentive spirometer in an  upright position. 3. Breathe out normally. 4. Place the mouthpiece in your mouth and seal your lips tightly around it. 5. Breathe in slowly and as deeply as possible, raising the piston or the ball toward the top of the column. 6. Hold your breath for 3-5 seconds or for as long as possible. Allow the piston or ball to fall to the bottom of the column. 7. Remove the mouthpiece from your mouth and breathe out normally. 8. Rest for a few seconds and repeat Steps 1 through 7 at least 10 times every 1-2 hours when you are awake. Take your time and take a few normal breaths between deep breaths. 9. The spirometer may include an indicator to  show your best effort. Use the indicator as a goal to work toward during each repetition. 10. After each set of 10 deep breaths, practice coughing to be sure your lungs are clear. If you have an incision (the cut made at the time of surgery), support your incision when coughing by placing a pillow or rolled up towels firmly against it. Once you are able to get out of bed, walk around indoors and cough well. You may stop using the incentive spirometer when instructed by your caregiver.  RISKS AND COMPLICATIONS  Take your time so you do not get dizzy or light-headed.  If you are in pain, you may need to take or ask for pain medication before doing incentive spirometry. It is harder to take a deep breath if you are having pain. AFTER USE  Rest and breathe slowly and easily.  It can be helpful to keep track of a log of your progress. Your caregiver can provide you with a simple table to help with this. If you are using the spirometer at home, follow these instructions: [place] IF:   You are having difficultly using the spirometer.  You have trouble using the spirometer as often as instructed.  Your pain medication is not giving enough relief while using the spirometer.  You develop fever of 100.5 F (38.1 C) or higher. SEEK IMMEDIATE MEDICAL CARE  IF:   You cough up bloody sputum that had not been present before.  You develop fever of 102 F (38.9 C) or greater.  You develop worsening pain at or near the incision site. MAKE SURE YOU:   Understand these instructions.  Will watch your condition.  Will get help right away if you are not doing well or get worse. Document Released: 12/06/2006 Document Revised: 10/18/2011 Document Reviewed: 02/06/2007 Exit[employer] Patient Information 2014 [employer], [place].   ________________________________________________________________________  WHAT IS A BLOOD TRANSFUSION? Blood Transfusion Information  A transfusion is the replacement of blood or some of its parts. Blood is made up of multiple cells which provide different functions.  Red blood cells carry oxygen and are used for blood loss replacement.  White blood cells fight against infection.  Platelets control bleeding.  Plasma helps clot blood.  Other blood products are available for specialized needs, such as hemophilia or other clotting disorders. BEFORE THE TRANSFUSION  Who gives blood for transfusions?   Healthy volunteers who are fully evaluated to make sure their blood is safe. This is blood bank blood. Transfusion therapy is the safest it has ever been in the practice of medicine. Before blood is taken from a donor, a complete history is taken to make sure that person has no history of diseases nor engages in risky social behavior (examples are intravenous drug use or sexual activity with multiple partners). The donor's travel history is screened to minimize risk of transmitting infections, such as malaria. The donated blood is tested for signs of infectious diseases, such as HIV and hepatitis. The blood is then tested to be sure it is compatible with you in order to minimize the chance of a transfusion reaction. If you or a relative donates blood, this is often done in anticipation of surgery and is not appropriate for emergency  situations. It takes many days to process the donated blood. RISKS AND COMPLICATIONS Although transfusion therapy is very safe and saves many lives, the main dangers of transfusion include:   Getting an infectious disease.  Developing a transfusion reaction. This is an allergic reaction  to something in the blood you were given. Every precaution is taken to prevent this. The decision to have a blood transfusion has been considered carefully by your caregiver before blood is given. Blood is not given unless the benefits outweigh the risks. AFTER THE TRANSFUSION  Right after receiving a blood transfusion, you will usually feel much better and more energetic. This is especially true if your red blood cells have gotten low (anemic). The transfusion raises the level of the red blood cells which carry oxygen, and this usually causes an energy increase.  The nurse administering the transfusion will monitor you carefully for complications. HOME CARE INSTRUCTIONS  No special instructions are needed after a transfusion. You may find your energy is better. Speak with your caregiver about any limitations on activity for underlying diseases you may have. SEEK MEDICAL CARE IF:   Your condition is not improving after your transfusion.  You develop redness or irritation at the intravenous (IV) site. SEEK IMMEDIATE MEDICAL CARE IF:  Any of the following symptoms occur over the next 12 hours:  Shaking chills.  You have a temperature by mouth above 102 F (38.9 C), not controlled by medicine.  Chest, back, or muscle pain.  People around you feel you are not acting correctly or are confused.  Shortness of breath or difficulty breathing.  Dizziness and fainting.  You get a rash or develop hives.  You have a decrease in urine output.  Your urine turns a dark color or changes to pink, red, or brown. Any of the following symptoms occur over the next 10 days:  You have a temperature by mouth above  102 F (38.9 C), not controlled by medicine.  Shortness of breath.  Weakness after normal activity.  The white part of the eye turns yellow (jaundice).  You have a decrease in the amount of urine or are urinating less often.  Your urine turns a dark color or changes to pink, red, or brown. Document Released: 07/23/2000 Document Revised: 10/18/2011 Document Reviewed: 03/11/2008 [facility] Patient Information 2014 [place], [place].  _______________________________________________________________________

## 2018-12-15 ENCOUNTER — Other Ambulatory Visit (HOSPITAL_COMMUNITY)
Admission: RE | Admit: 2018-12-15 | Discharge: 2018-12-15 | Disposition: A | Discharge status: Home / Self Care | Payer: Medicare Other | Source: Ambulatory Visit | Attending: Orthopedic Surgery | Admitting: Orthopedic Surgery

## 2018-12-15 ENCOUNTER — Encounter (HOSPITAL_COMMUNITY)
Admission: RE | Admit: 2018-12-15 | Discharge: 2018-12-15 | Disposition: A | Discharge status: Home / Self Care | Payer: Medicare Other | Source: Ambulatory Visit | Attending: Orthopedic Surgery | Admitting: Orthopedic Surgery

## 2018-12-15 ENCOUNTER — Other Ambulatory Visit: Payer: Self-pay

## 2018-12-15 ENCOUNTER — Encounter (HOSPITAL_COMMUNITY): Payer: Self-pay

## 2018-12-15 DIAGNOSIS — Z1159 Encounter for screening for other viral diseases: Secondary | ICD-10-CM | POA: Diagnosis not present

## 2018-12-15 DIAGNOSIS — M1612 Unilateral primary osteoarthritis, left hip: Secondary | ICD-10-CM | POA: Insufficient documentation

## 2018-12-15 DIAGNOSIS — Z01818 Encounter for other preprocedural examination: Secondary | ICD-10-CM | POA: Diagnosis not present

## 2018-12-15 DIAGNOSIS — I739 Peripheral vascular disease, unspecified: Secondary | ICD-10-CM | POA: Diagnosis not present

## 2018-12-15 DIAGNOSIS — Z96653 Presence of artificial knee joint, bilateral: Secondary | ICD-10-CM | POA: Insufficient documentation

## 2018-12-15 DIAGNOSIS — K219 Gastro-esophageal reflux disease without esophagitis: Secondary | ICD-10-CM | POA: Diagnosis not present

## 2018-12-15 DIAGNOSIS — I10 Essential (primary) hypertension: Secondary | ICD-10-CM | POA: Insufficient documentation

## 2018-12-15 DIAGNOSIS — Z86711 Personal history of pulmonary embolism: Secondary | ICD-10-CM | POA: Insufficient documentation

## 2018-12-15 DIAGNOSIS — I341 Nonrheumatic mitral (valve) prolapse: Secondary | ICD-10-CM | POA: Insufficient documentation

## 2018-12-15 DIAGNOSIS — Z79899 Other long term (current) drug therapy: Secondary | ICD-10-CM | POA: Insufficient documentation

## 2018-12-15 LAB — PROTIME-INR
INR: 1.1 (ref 0.8–1.2)
Prothrombin Time: 13.8 seconds (ref 11.4–15.2)

## 2018-12-15 LAB — COMPREHENSIVE METABOLIC PANEL
ALT: 33 U/L (ref 0–44)
AST: 24 U/L (ref 15–41)
Albumin: 4.2 g/dL (ref 3.5–5.0)
Alkaline Phosphatase: 73 U/L (ref 38–126)
Anion gap: 7 (ref 5–15)
BUN: 21 mg/dL (ref 8–23)
CO2: 26 mmol/L (ref 22–32)
Calcium: 9.1 mg/dL (ref 8.9–10.3)
Chloride: 106 mmol/L (ref 98–111)
Creatinine, Ser: 0.94 mg/dL (ref 0.44–1.00)
GFR calc Af Amer: >60 mL/min (ref >60)
GFR calc non Af Amer: >60 mL/min (ref >60)
Glucose, Bld: 80 mg/dL (ref 70–99)
Potassium: 4.1 mmol/L (ref 3.5–5.1)
Sodium: 139 mmol/L (ref 135–145)
Total Bilirubin: 0.6 mg/dL (ref 0.3–1.2)
Total Protein: 6.4 g/dL — ABNORMAL LOW (ref 6.5–8.1)

## 2018-12-15 LAB — CBC
HCT: 42.9 % (ref 36.0–46.0)
Hemoglobin: 13.6 g/dL (ref 12.0–15.0)
MCH: 30.3 pg (ref 26.0–34.0)
MCHC: 31.7 g/dL (ref 30.0–36.0)
MCV: 95.5 fL (ref 80.0–100.0)
Platelets: 180 10*3/uL (ref 150–400)
RBC: 4.49 MIL/uL (ref 3.87–5.11)
RDW: 12.2 % (ref 11.5–15.5)
WBC: 3.9 10*3/uL — ABNORMAL LOW (ref 4.0–10.5)
nRBC: 0 % (ref 0.0–0.2)

## 2018-12-15 LAB — SURGICAL PCR SCREEN
MRSA, PCR: NEGATIVE
Staphylococcus aureus: NEGATIVE

## 2018-12-15 LAB — APTT: aPTT: 29 seconds (ref 24–36)

## 2018-12-16 LAB — NOVEL CORONAVIRUS, NAA (HOSP ORDER, SEND-OUT TO REF LAB; TAT 18-24 HRS): SARS-CoV-2, NAA: NOT DETECTED

## 2018-12-18 NOTE — Progress Notes (Signed)
09-29-18 EKG on chart and LOV w/Dr. Cliffton Asters  10-03-18 Surgical Clearance from Dr. Cliffton Asters on chart.

## 2018-12-18 NOTE — Progress Notes (Signed)
Anesthesia Chart Review   Case:  [id number] Date/Time:  12/20/18 1115   Procedure:  TOTAL HIP ARTHROPLASTY ANTERIOR APPROACH (Left ) -   Anesthesia type:  Choice   Pre-op diagnosis:  left hip osteoarthritis   Location:  [facility] ROOM 09 / [facility]   Surgeon:  Ollen Gross, MD      DISCUSSION: 68 yo never smoker with h/o HTN, PE (1985 post op), migraines, MVP, PVD, GERD, left hip OA scheduled for above procedure 12/20/2018 with Dr. Ollen Gross.   Pt seen by PCP, Dr. Laurann Montana, 2/25/220 for pre-op evaluation.  Per her note, "Patient is very healthy and has good exercise tolerance.  She should do very well with surgery.  I am going to adjust her blood pressure medicines as she is bradycardias.  We'll try switching from atenolol to low-dose metoprolol.  She will call with an update in a week.  Her blood pressure should be supple long before her surgery.  Of note she does have a history of pulmonary embolus after sinus surgery in the 1980s.  We will be sure her orthopedist is aware of this."  Negative COVID19 nasopharyngeal swab 12/15/2018.   Pt can proceed with planned procedure barring acute status change.  VS: BP 137/71 (BP Location: Right Arm)   Pulse 64   Temp 36.8 C (Oral)   Resp 18   Ht 5\' 7"  (1.702 m)   Wt 83.5 kg   SpO2 100%   BMI 28.82 kg/m   PROVIDERS: Laurann Montana, MD is PCP    LABS: Labs reviewed: Acceptable for surgery. (all labs ordered are listed, but only abnormal results are displayed)  Labs Reviewed  CBC - Abnormal; Notable for the following components:      Result Value   WBC 3.9 (*)    All other components within normal limits  COMPREHENSIVE METABOLIC PANEL - Abnormal; Notable for the following components:   Total Protein 6.4 (*)    All other components within normal limits  SURGICAL PCR SCREEN  APTT  PROTIME-INR  TYPE AND SCREEN     IMAGES:   EKG: 10/03/2018 (on chart) Rate 47 bpm Bradycardia   CV:  Past Medical History:  Diagnosis  Date  . Arthritis   . Headache    migraines  . Heart murmur    MVP  . Heartburn   . Hypertension   . Peripheral vascular disease (HCC)   . Pulmonary embolism (HCC) 05/1984   age 30  . Vasomotor rhinitis     Past Surgical History:  Procedure Laterality Date  . ABDOMINAL HYSTERECTOMY    . JOINT REPLACEMENT Left    knee  . TOTAL KNEE ARTHROPLASTY Right 07/26/2016   Procedure: RIGHT TOTAL KNEE ARTHROPLASTY;  Surgeon: Ollen Gross, MD;  Location: [facility];  Service: Orthopedics;  Laterality: Right;    MEDICATIONS: . acetaminophen (TYLENOL) 500 MG tablet  . estradiol (ESTRACE) 0.1 MG/GM vaginal cream  . metoprolol succinate (TOPROL-XL) 25 MG 24 hr tablet  . RELPAX 40 MG tablet  . traZODone (DESYREL) 100 MG tablet  . zolpidem (AMBIEN) 10 MG tablet   No current facility-administered medications for this encounter.     Janey Genta [facility] Pre-Surgical Testing [phone] 12/18/18 10:57 AM

## 2018-12-18 NOTE — Anesthesia Preprocedure Evaluation (Addendum)
Anesthesia Evaluation  Patient identified by MRN, date of birth, ID band Patient awake    Reviewed: Allergy & Precautions, NPO status , Patient's Chart, lab work & pertinent test results, reviewed documented beta blocker date and time   History of Anesthesia Complications Negative for: history of anesthetic complications  Airway Mallampati: II  TM Distance: >3 FB Neck ROM: Full    Dental no notable dental hx.    Pulmonary neg pulmonary ROS,  Hx of PE in 1985 (s/p sinus surgery)   Pulmonary exam normal breath sounds clear to auscultation       Cardiovascular hypertension, Pt. on home beta blockers and Pt. on medications + Peripheral Vascular Disease  Normal cardiovascular exam+ Valvular Problems/Murmurs MVP  Rhythm:Regular Rate:Normal     Neuro/Psych  Headaches,    GI/Hepatic negative GI ROS, Neg liver ROS,   Endo/Other  negative endocrine ROS  Renal/GU negative Renal ROS     Musculoskeletal  (+) Arthritis ,   Abdominal   Peds  Hematology negative hematology ROS (+)   Anesthesia Other Findings Day of surgery medications reviewed with the patient.  Reproductive/Obstetrics                           Anesthesia Physical Anesthesia Plan  ASA: II  Anesthesia Plan: Spinal   Post-op Pain Management:    Induction:   PONV Risk Score and Plan: 3 and Midazolam, Ondansetron, Dexamethasone and Treatment may vary due to age or medical condition  Airway Management Planned: Natural Airway and Simple Face Mask  Additional Equipment:   Intra-op Plan:   Post-operative Plan:   Informed Consent: I have reviewed the patients History and Physical, chart, labs and discussed the procedure including the risks, benefits and alternatives for the proposed anesthesia with the patient or authorized representative who has indicated his/her understanding and acceptance.     Dental advisory given  Plan  Discussed with: CRNA  Anesthesia Plan Comments: (See PAT note 12/15/18, Jodell Cipro, PA-C)      Anesthesia Quick Evaluation

## 2018-12-19 MED ORDER — TRANEXAMIC ACID 1000 MG/10ML IV SOLN
2000.0000 mg | Freq: Once | INTRAVENOUS | Status: DC
  Filled 2018-12-19: qty 20

## 2018-12-20 ENCOUNTER — Observation Stay (HOSPITAL_COMMUNITY)
Admission: RE | Admit: 2018-12-20 | Discharge: 2018-12-21 | Disposition: A | Discharge status: Home / Self Care | Payer: Medicare Other | Attending: Orthopedic Surgery | Admitting: Orthopedic Surgery

## 2018-12-20 ENCOUNTER — Encounter (HOSPITAL_COMMUNITY)
Admission: RE | Disposition: A | Discharge status: Home / Self Care | Payer: Self-pay | Source: Home / Self Care | Attending: Orthopedic Surgery

## 2018-12-20 ENCOUNTER — Inpatient Hospital Stay (HOSPITAL_COMMUNITY): Payer: Medicare Other

## 2018-12-20 ENCOUNTER — Other Ambulatory Visit: Payer: Self-pay

## 2018-12-20 ENCOUNTER — Encounter (HOSPITAL_COMMUNITY): Payer: Self-pay | Admitting: Emergency Medicine

## 2018-12-20 ENCOUNTER — Inpatient Hospital Stay (HOSPITAL_COMMUNITY): Payer: Medicare Other | Admitting: Anesthesiology

## 2018-12-20 ENCOUNTER — Inpatient Hospital Stay (HOSPITAL_COMMUNITY): Payer: Medicare Other | Admitting: Physician Assistant

## 2018-12-20 ENCOUNTER — Observation Stay (HOSPITAL_COMMUNITY): Payer: Medicare Other

## 2018-12-20 DIAGNOSIS — Z471 Aftercare following joint replacement surgery: Secondary | ICD-10-CM | POA: Diagnosis not present

## 2018-12-20 DIAGNOSIS — Z79899 Other long term (current) drug therapy: Secondary | ICD-10-CM | POA: Insufficient documentation

## 2018-12-20 DIAGNOSIS — M169 Osteoarthritis of hip, unspecified: Secondary | ICD-10-CM | POA: Diagnosis present

## 2018-12-20 DIAGNOSIS — Z888 Allergy status to other drugs, medicaments and biological substances status: Secondary | ICD-10-CM | POA: Insufficient documentation

## 2018-12-20 DIAGNOSIS — Z96642 Presence of left artificial hip joint: Secondary | ICD-10-CM | POA: Diagnosis not present

## 2018-12-20 DIAGNOSIS — M1612 Unilateral primary osteoarthritis, left hip: Secondary | ICD-10-CM | POA: Diagnosis present

## 2018-12-20 DIAGNOSIS — N183 Chronic kidney disease, stage 3 (moderate): Secondary | ICD-10-CM | POA: Insufficient documentation

## 2018-12-20 DIAGNOSIS — I129 Hypertensive chronic kidney disease with stage 1 through stage 4 chronic kidney disease, or unspecified chronic kidney disease: Secondary | ICD-10-CM | POA: Diagnosis not present

## 2018-12-20 DIAGNOSIS — G43909 Migraine, unspecified, not intractable, without status migrainosus: Secondary | ICD-10-CM | POA: Diagnosis not present

## 2018-12-20 DIAGNOSIS — Z96649 Presence of unspecified artificial hip joint: Secondary | ICD-10-CM

## 2018-12-20 DIAGNOSIS — Z419 Encounter for procedure for purposes other than remedying health state, unspecified: Secondary | ICD-10-CM

## 2018-12-20 DIAGNOSIS — I739 Peripheral vascular disease, unspecified: Secondary | ICD-10-CM | POA: Insufficient documentation

## 2018-12-20 DIAGNOSIS — Z86711 Personal history of pulmonary embolism: Secondary | ICD-10-CM | POA: Insufficient documentation

## 2018-12-20 DIAGNOSIS — Z96651 Presence of right artificial knee joint: Secondary | ICD-10-CM | POA: Insufficient documentation

## 2018-12-20 DIAGNOSIS — M7740 Metatarsalgia, unspecified foot: Secondary | ICD-10-CM | POA: Diagnosis not present

## 2018-12-20 DIAGNOSIS — M179 Osteoarthritis of knee, unspecified: Secondary | ICD-10-CM | POA: Diagnosis not present

## 2018-12-20 HISTORY — PX: TOTAL HIP ARTHROPLASTY: SHX124

## 2018-12-20 LAB — TYPE AND SCREEN
ABO/RH(D): A POS
Antibody Screen: NEGATIVE

## 2018-12-20 SURGERY — ARTHROPLASTY, HIP, TOTAL, ANTERIOR APPROACH
Anesthesia: Spinal | Site: Hip | Laterality: Left

## 2018-12-20 MED ORDER — POLYETHYLENE GLYCOL 3350 17 G PO PACK: 17.0000 g | PACK | Freq: Every day | ORAL | Status: DC | PRN

## 2018-12-20 MED ORDER — METHOCARBAMOL 500 MG IVPB - SIMPLE MED
500.0000 mg | Freq: Four times a day (QID) | INTRAVENOUS | Status: DC | PRN
  Administered 2018-12-20: 500 mg via INTRAVENOUS
  Filled 2018-12-20: qty 50

## 2018-12-20 MED ORDER — ONDANSETRON HCL 4 MG/2ML IJ SOLN
INTRAMUSCULAR | Status: AC
  Filled 2018-12-20: qty 2

## 2018-12-20 MED ORDER — DOCUSATE SODIUM 100 MG PO CAPS
100.0000 mg | ORAL_CAPSULE | Freq: Two times a day (BID) | ORAL | Status: DC
  Administered 2018-12-20 – 2018-12-21 (×2): 100 mg via ORAL
  Filled 2018-12-20 (×2): qty 1

## 2018-12-20 MED ORDER — TRAMADOL HCL 50 MG PO TABS: 50.0000 mg | ORAL_TABLET | Freq: Four times a day (QID) | ORAL | Status: DC | PRN

## 2018-12-20 MED ORDER — FENTANYL CITRATE (PF) 250 MCG/5ML IJ SOLN
INTRAMUSCULAR | Status: DC | PRN
  Administered 2018-12-20: 50 ug via INTRAVENOUS

## 2018-12-20 MED ORDER — METOCLOPRAMIDE HCL 5 MG PO TABS: 5.0000 mg | ORAL_TABLET | Freq: Three times a day (TID) | ORAL | Status: DC | PRN

## 2018-12-20 MED ORDER — METOPROLOL SUCCINATE ER 25 MG PO TB24
25.0000 mg | ORAL_TABLET | Freq: Every day | ORAL | Status: DC
  Administered 2018-12-21: 09:00:00 25 mg via ORAL
  Filled 2018-12-20: qty 1

## 2018-12-20 MED ORDER — BUPIVACAINE-EPINEPHRINE (PF) 0.25% -1:200000 IJ SOLN
INTRAMUSCULAR | Status: AC
  Filled 2018-12-20: qty 30

## 2018-12-20 MED ORDER — MORPHINE SULFATE (PF) 2 MG/ML IV SOLN
0.5000 mg | INTRAVENOUS | Status: DC | PRN
  Administered 2018-12-20 – 2018-12-21 (×2): 1 mg via INTRAVENOUS
  Filled 2018-12-20 (×2): qty 1

## 2018-12-20 MED ORDER — OXYCODONE HCL 5 MG/5ML PO SOLN: 5.0000 mg | Freq: Once | ORAL | Status: DC | PRN

## 2018-12-20 MED ORDER — ACETAMINOPHEN 10 MG/ML IV SOLN: 1000.0000 mg | Freq: Once | INTRAVENOUS | Status: DC | PRN

## 2018-12-20 MED ORDER — PROPOFOL 10 MG/ML IV BOLUS
INTRAVENOUS | Status: AC
  Filled 2018-12-20: qty 20

## 2018-12-20 MED ORDER — DEXAMETHASONE SODIUM PHOSPHATE 10 MG/ML IJ SOLN
8.0000 mg | Freq: Once | INTRAMUSCULAR | Status: AC
  Administered 2018-12-20: 8 mg via INTRAVENOUS

## 2018-12-20 MED ORDER — SODIUM CHLORIDE 0.9 % IV SOLN
INTRAVENOUS | Status: DC | PRN
  Administered 2018-12-20: 20 ug/min via INTRAVENOUS

## 2018-12-20 MED ORDER — PHENYLEPHRINE HCL (PRESSORS) 10 MG/ML IV SOLN
INTRAVENOUS | Status: AC
  Filled 2018-12-20: qty 2

## 2018-12-20 MED ORDER — HYDROMORPHONE HCL 1 MG/ML IJ SOLN
INTRAMUSCULAR | Status: AC
  Administered 2018-12-20: 0.5 mg via INTRAVENOUS
  Filled 2018-12-20: qty 1

## 2018-12-20 MED ORDER — MIDAZOLAM HCL 2 MG/2ML IJ SOLN
INTRAMUSCULAR | Status: DC | PRN
  Administered 2018-12-20: 2 mg via INTRAVENOUS

## 2018-12-20 MED ORDER — METOCLOPRAMIDE HCL 5 MG/ML IJ SOLN: 5.0000 mg | Freq: Three times a day (TID) | INTRAMUSCULAR | Status: DC | PRN

## 2018-12-20 MED ORDER — DEXAMETHASONE SODIUM PHOSPHATE 10 MG/ML IJ SOLN
10.0000 mg | Freq: Once | INTRAMUSCULAR | Status: DC
  Filled 2018-12-20: qty 1

## 2018-12-20 MED ORDER — RIVAROXABAN 10 MG PO TABS
10.0000 mg | ORAL_TABLET | Freq: Every day | ORAL | Status: DC
  Administered 2018-12-21: 10 mg via ORAL
  Filled 2018-12-20: qty 1

## 2018-12-20 MED ORDER — ACETAMINOPHEN 500 MG PO TABS
500.0000 mg | ORAL_TABLET | Freq: Four times a day (QID) | ORAL | Status: DC
  Administered 2018-12-20 – 2018-12-21 (×3): 500 mg via ORAL
  Filled 2018-12-20 (×3): qty 1

## 2018-12-20 MED ORDER — LACTATED RINGERS IV SOLN
INTRAVENOUS | Status: DC
  Administered 2018-12-20 (×2): via INTRAVENOUS

## 2018-12-20 MED ORDER — POVIDONE-IODINE 10 % EX SWAB
2.0000 "application " | Freq: Once | CUTANEOUS | Status: AC
  Administered 2018-12-20: 2 via TOPICAL

## 2018-12-20 MED ORDER — FENTANYL CITRATE (PF) 100 MCG/2ML IJ SOLN
INTRAMUSCULAR | Status: AC
  Filled 2018-12-20: qty 2

## 2018-12-20 MED ORDER — ONDANSETRON HCL 4 MG/2ML IJ SOLN
INTRAMUSCULAR | Status: DC | PRN
  Administered 2018-12-20: 4 mg via INTRAVENOUS

## 2018-12-20 MED ORDER — LIDOCAINE 2% (20 MG/ML) 5 ML SYRINGE
INTRAMUSCULAR | Status: DC | PRN
  Administered 2018-12-20: 40 mg via INTRAVENOUS

## 2018-12-20 MED ORDER — ONDANSETRON HCL 4 MG PO TABS: 4.0000 mg | ORAL_TABLET | Freq: Four times a day (QID) | ORAL | Status: DC | PRN

## 2018-12-20 MED ORDER — HYDROMORPHONE HCL 1 MG/ML IJ SOLN
0.2500 mg | INTRAMUSCULAR | Status: DC | PRN
  Administered 2018-12-20 (×4): 0.5 mg via INTRAVENOUS

## 2018-12-20 MED ORDER — PROPOFOL 500 MG/50ML IV EMUL
INTRAVENOUS | Status: DC | PRN
  Administered 2018-12-20: 135 ug/kg/min via INTRAVENOUS

## 2018-12-20 MED ORDER — BUPIVACAINE-EPINEPHRINE (PF) 0.25% -1:200000 IJ SOLN
INTRAMUSCULAR | Status: DC | PRN
  Administered 2018-12-20: 30 mL

## 2018-12-20 MED ORDER — MAGNESIUM CITRATE PO SOLN: 1.0000 | Freq: Once | ORAL | Status: DC | PRN

## 2018-12-20 MED ORDER — OXYCODONE HCL 5 MG PO TABS: 5.0000 mg | ORAL_TABLET | Freq: Once | ORAL | Status: DC | PRN

## 2018-12-20 MED ORDER — PHENOL 1.4 % MT LIQD: 1.0000 | OROMUCOSAL | Status: DC | PRN

## 2018-12-20 MED ORDER — BUPIVACAINE IN DEXTROSE 0.75-8.25 % IT SOLN
INTRATHECAL | Status: DC | PRN
  Administered 2018-12-20: 1.8 mL via INTRATHECAL

## 2018-12-20 MED ORDER — CHLORHEXIDINE GLUCONATE 4 % EX LIQD: 60.0000 mL | Freq: Once | CUTANEOUS | Status: DC

## 2018-12-20 MED ORDER — PROPOFOL 10 MG/ML IV BOLUS
INTRAVENOUS | Status: AC
  Filled 2018-12-20: qty 80

## 2018-12-20 MED ORDER — MIDAZOLAM HCL 2 MG/2ML IJ SOLN
INTRAMUSCULAR | Status: AC
  Filled 2018-12-20: qty 2

## 2018-12-20 MED ORDER — PHENYLEPHRINE 40 MCG/ML (10ML) SYRINGE FOR IV PUSH (FOR BLOOD PRESSURE SUPPORT)
PREFILLED_SYRINGE | INTRAVENOUS | Status: AC
  Filled 2018-12-20: qty 10

## 2018-12-20 MED ORDER — CEFAZOLIN SODIUM-DEXTROSE 2-4 GM/100ML-% IV SOLN
2.0000 g | Freq: Four times a day (QID) | INTRAVENOUS | Status: AC
  Administered 2018-12-20 (×2): 2 g via INTRAVENOUS
  Filled 2018-12-20 (×2): qty 100

## 2018-12-20 MED ORDER — ONDANSETRON HCL 4 MG/2ML IJ SOLN: 4.0000 mg | Freq: Four times a day (QID) | INTRAMUSCULAR | Status: DC | PRN

## 2018-12-20 MED ORDER — MENTHOL 3 MG MT LOZG: 1.0000 | LOZENGE | OROMUCOSAL | Status: DC | PRN

## 2018-12-20 MED ORDER — METHOCARBAMOL 500 MG PO TABS
500.0000 mg | ORAL_TABLET | Freq: Four times a day (QID) | ORAL | Status: DC | PRN
  Administered 2018-12-20 – 2018-12-21 (×2): 500 mg via ORAL
  Filled 2018-12-20 (×3): qty 1

## 2018-12-20 MED ORDER — ACETAMINOPHEN 10 MG/ML IV SOLN
1000.0000 mg | Freq: Four times a day (QID) | INTRAVENOUS | Status: DC
  Administered 2018-12-20: 11:00:00 1000 mg via INTRAVENOUS
  Filled 2018-12-20: qty 100

## 2018-12-20 MED ORDER — SODIUM CHLORIDE 0.9 % IR SOLN
Status: DC | PRN
  Administered 2018-12-20: 1000 mL

## 2018-12-20 MED ORDER — TRAZODONE HCL 100 MG PO TABS
100.0000 mg | ORAL_TABLET | Freq: Every day | ORAL | Status: DC
  Administered 2018-12-20: 22:00:00 100 mg via ORAL
  Filled 2018-12-20: qty 1

## 2018-12-20 MED ORDER — PROMETHAZINE HCL 25 MG/ML IJ SOLN: 6.2500 mg | INTRAMUSCULAR | Status: DC | PRN

## 2018-12-20 MED ORDER — PROPOFOL 10 MG/ML IV BOLUS
INTRAVENOUS | Status: DC | PRN
  Administered 2018-12-20: 10 mg via INTRAVENOUS

## 2018-12-20 MED ORDER — SODIUM CHLORIDE 0.9 % IV SOLN
INTRAVENOUS | Status: DC
  Administered 2018-12-20: 75 mL/h via INTRAVENOUS

## 2018-12-20 MED ORDER — DEXAMETHASONE SODIUM PHOSPHATE 10 MG/ML IJ SOLN
INTRAMUSCULAR | Status: AC
  Filled 2018-12-20: qty 1

## 2018-12-20 MED ORDER — PHENYLEPHRINE 40 MCG/ML (10ML) SYRINGE FOR IV PUSH (FOR BLOOD PRESSURE SUPPORT)
PREFILLED_SYRINGE | INTRAVENOUS | Status: DC | PRN
  Administered 2018-12-20 (×3): 120 ug via INTRAVENOUS

## 2018-12-20 MED ORDER — CEFAZOLIN SODIUM-DEXTROSE 2-4 GM/100ML-% IV SOLN
2.0000 g | INTRAVENOUS | Status: AC
  Administered 2018-12-20: 2 g via INTRAVENOUS
  Filled 2018-12-20: qty 100

## 2018-12-20 MED ORDER — BISACODYL 10 MG RE SUPP: 10.0000 mg | Freq: Every day | RECTAL | Status: DC | PRN

## 2018-12-20 MED ORDER — METHOCARBAMOL 500 MG IVPB - SIMPLE MED
INTRAVENOUS | Status: AC
  Filled 2018-12-20: qty 50

## 2018-12-20 MED ORDER — HYDROCODONE-ACETAMINOPHEN 5-325 MG PO TABS
1.0000 | ORAL_TABLET | ORAL | Status: DC | PRN
  Administered 2018-12-20 – 2018-12-21 (×3): 2 via ORAL
  Filled 2018-12-20 (×3): qty 2

## 2018-12-20 SURGICAL SUPPLY — 44 items
BLADE SAG 18X100X1.27 (BLADE) ×3 IMPLANT
BLADE SURG SZ10 CARB STEEL (BLADE) ×6 IMPLANT
CHLORAPREP W/TINT 26 (MISCELLANEOUS) ×3 IMPLANT
CLOSURE WOUND 1/2 X4 (GAUZE/BANDAGES/DRESSINGS) ×1
COVER PERINEAL POST (MISCELLANEOUS) ×3 IMPLANT
COVER SURGICAL LIGHT HANDLE (MISCELLANEOUS) ×3 IMPLANT
COVER WAND RF STERILE (DRAPES) IMPLANT
CUP ACET PINNACLE SECTR 50MM (Hips) ×1 IMPLANT
DECANTER SPIKE VIAL GLASS SM (MISCELLANEOUS) ×3 IMPLANT
DRAPE STERI IOBAN 125X83 (DRAPES) ×3 IMPLANT
DRAPE U-SHAPE 47X51 STRL (DRAPES) ×6 IMPLANT
DRSG ADAPTIC 3X8 NADH LF (GAUZE/BANDAGES/DRESSINGS) ×3 IMPLANT
DRSG MEPILEX BORDER 4X4 (GAUZE/BANDAGES/DRESSINGS) ×3 IMPLANT
DRSG MEPILEX BORDER 4X8 (GAUZE/BANDAGES/DRESSINGS) ×3 IMPLANT
ELECT REM PT RETURN 15FT ADLT (MISCELLANEOUS) ×3 IMPLANT
EVACUATOR 1/8 PVC DRAIN (DRAIN) ×3 IMPLANT
GLOVE BIO SURGEON STRL SZ 6 (GLOVE) ×3 IMPLANT
GLOVE BIO SURGEON STRL SZ7 (GLOVE) ×3 IMPLANT
GLOVE BIO SURGEON STRL SZ8 (GLOVE) ×3 IMPLANT
GLOVE BIOGEL PI IND STRL 6.5 (GLOVE) ×1 IMPLANT
GLOVE BIOGEL PI IND STRL 7.0 (GLOVE) ×1 IMPLANT
GLOVE BIOGEL PI IND STRL 8 (GLOVE) ×1 IMPLANT
GLOVE BIOGEL PI INDICATOR 6.5 (GLOVE) ×2
GLOVE BIOGEL PI INDICATOR 7.0 (GLOVE) ×2
GLOVE BIOGEL PI INDICATOR 8 (GLOVE) ×2
GLOVE INDICATOR 6.5 STRL GRN (GLOVE) ×3 IMPLANT
GOWN STRL REUS W/TWL LRG LVL3 (GOWN DISPOSABLE) ×12 IMPLANT
HEAD FEMORAL 32 CERAMIC (Hips) ×3 IMPLANT
HOLDER FOLEY CATH W/STRAP (MISCELLANEOUS) ×3 IMPLANT
KIT TURNOVER KIT A (KITS) IMPLANT
LINER MARATHON 32 50 (Hips) ×3 IMPLANT
MANIFOLD NEPTUNE II (INSTRUMENTS) ×3 IMPLANT
PACK ANTERIOR HIP CUSTOM (KITS) ×3 IMPLANT
PINNACLE SECTOR CUP 50MM (Hips) ×3 IMPLANT
STEM FEMORAL SZ 5MM STD ACTIS (Stem) ×3 IMPLANT
STRIP CLOSURE SKIN 1/2X4 (GAUZE/BANDAGES/DRESSINGS) ×2 IMPLANT
SUT ETHIBOND NAB CT1 #1 30IN (SUTURE) ×3 IMPLANT
SUT MNCRL AB 4-0 PS2 18 (SUTURE) ×3 IMPLANT
SUT STRATAFIX 0 PDS 27 VIOLET (SUTURE) ×3
SUT VIC AB 2-0 CT1 27 (SUTURE) ×4
SUT VIC AB 2-0 CT1 TAPERPNT 27 (SUTURE) ×2 IMPLANT
SUTURE STRATFX 0 PDS 27 VIOLET (SUTURE) ×1 IMPLANT
TRAY FOLEY MTR SLVR 14FR STAT (SET/KITS/TRAYS/PACK) ×3 IMPLANT
YANKAUER SUCT BULB TIP 10FT TU (MISCELLANEOUS) ×3 IMPLANT

## 2018-12-20 NOTE — Transfer of Care (Signed)
Immediate Anesthesia Transfer of Care Note  Patient: Jennifer Russell  Procedure(s) Performed: TOTAL HIP ARTHROPLASTY ANTERIOR APPROACH (Left Hip)  Patient Location: PACU  Anesthesia Type:Spinal  Level of Consciousness: sedated  Airway & Oxygen Therapy: Patient Spontanous Breathing and Patient connected to face mask oxygen  Post-op Assessment: Report given to RN and Post -op Vital signs reviewed and stable  Post vital signs: Reviewed and stable  Last Vitals:  Vitals Value Taken Time  BP    Temp    Pulse 69 12/20/2018 12:25 PM  Resp 12 12/20/2018 12:25 PM  SpO2 100 % 12/20/2018 12:25 PM  Vitals shown include unvalidated device data.  Last Pain:  Vitals:   12/20/18 0927  TempSrc:   PainSc: 0-No pain      Patients Stated Pain Goal: 4 (12/20/18 7673)  Complications: No apparent anesthesia complications

## 2018-12-20 NOTE — Interval H&P Note (Signed)
History and Physical Interval Note:  12/20/2018 9:44 AM  Jennifer Russell  has presented today for surgery, with the diagnosis of left hip osteoarthritis.  The various methods of treatment have been discussed with the patient and family. After consideration of risks, benefits and other options for treatment, the patient has consented to  Procedure(s) with comments: TOTAL HIP ARTHROPLASTY ANTERIOR APPROACH (Left) - as a surgical intervention.  The patient's history has been reviewed, patient examined, no change in status, stable for surgery.  I have reviewed the patient's chart and labs.  Questions were answered to the patient's satisfaction.     Homero Fellers Basya Casavant

## 2018-12-20 NOTE — Op Note (Signed)
OPERATIVE REPORT- TOTAL HIP ARTHROPLASTY   PREOPERATIVE DIAGNOSIS: Osteoarthritis of the Left hip.   POSTOPERATIVE DIAGNOSIS: Osteoarthritis of the Left  hip.   PROCEDURE: Left total hip arthroplasty, anterior approach.   SURGEON: Ollen Gross, MD   ASSISTANT: Dennie Bible, PA-C  ANESTHESIA:  Spinal  ESTIMATED BLOOD LOSS:-100 mL    DRAINS: Hemovac x1.   COMPLICATIONS: None   CONDITION: PACU - hemodynamically stable.   BRIEF CLINICAL NOTE: Jennifer Russell is a 68 y.o. female who has advanced end-  stage arthritis of their Left  hip with progressively worsening pain and  dysfunction.The patient has failed nonoperative management and presents for  total hip arthroplasty.   PROCEDURE IN DETAIL: After successful administration of spinal  anesthetic, the traction boots for the [facility] bed were placed on both  feet and the patient was placed onto the [facility] bed, boots placed into the leg  holders. The Left hip was then isolated from the perineum with plastic  drapes and prepped and draped in the usual sterile fashion. ASIS and  greater trochanter were marked and a oblique incision was made, starting  at about 1 cm lateral and 2 cm distal to the ASIS and coursing towards  the anterior cortex of the femur. The skin was cut with a 10 blade  through subcutaneous tissue to the level of the fascia overlying the  tensor fascia lata muscle. The fascia was then incised in line with the  incision at the junction of the anterior third and posterior 2/3rd. The  muscle was teased off the fascia and then the interval between the TFL  and the rectus was developed. The Hohmann retractor was then placed at  the top of the femoral neck over the capsule. The vessels overlying the  capsule were cauterized and the fat on top of the capsule was removed.  A Hohmann retractor was then placed anterior underneath the rectus  femoris to give exposure to the entire anterior capsule. A T-shaped   capsulotomy was performed. The edges were tagged and the femoral head  was identified.       Osteophytes are removed off the superior acetabulum.  The femoral neck was then cut in situ with an oscillating saw. Traction  was then applied to the left lower extremity utilizing the [facility]  traction. The femoral head was then removed. Retractors were placed  around the acetabulum and then circumferential removal of the labrum was  performed. Osteophytes were also removed. Reaming starts at 47 mm to  medialize and  Increased in 2 mm increments to 49 mm. We reamed in  approximately 40 degrees of abduction, 20 degrees anteversion. A 50 mm  pinnacle acetabular shell was then impacted in anatomic position under  fluoroscopic guidance with excellent purchase. We did not need to place  any additional dome screws. A 32 mm neutral + 4 marathon liner was then  placed into the acetabular shell.       The femoral lift was then placed along the lateral aspect of the femur  just distal to the vastus ridge. The leg was  externally rotated and capsule  was stripped off the inferior aspect of the femoral neck down to the  level of the lesser trochanter, this was done with electrocautery. The femur was lifted after this was performed. The  leg was then placed in an extended and adducted position essentially delivering the femur. We also removed the capsule superiorly and the piriformis from the piriformis  fossa to gain excellent exposure of the  proximal femur. Rongeur was used to remove some cancellous bone to get  into the lateral portion of the proximal femur for placement of the  initial starter reamer. The starter broaches was placed  the starter broach  and was shown to go down the center of the canal. Broaching  with the Actis system was then performed starting at size 0  coursing  Up to size 5. A size 5 had excellent torsional and rotational  and axial stability. The trial standard offset neck was then  placed  with a 32 + 1 trial head. The hip was then reduced. We confirmed that  the stem was in the canal both on AP and lateral x-rays. It also has excellent sizing. The hip was reduced with outstanding stability through full extension and full external rotation.. AP pelvis was taken and the leg lengths were measured and found to be equal. Hip was then dislocated again and the femoral head and neck removed. The  femoral broach was removed. Size 5 Actis stem with a standard offset  neck was then impacted into the femur following native anteversion. Has  excellent purchase in the canal. Excellent torsional and rotational and  axial stability. It is confirmed to be in the canal on AP and lateral  fluoroscopic views. The 32 + 1 ceramic head was placed and the hip  reduced with outstanding stability. Again AP pelvis was taken and it  confirmed that the leg lengths were equal. The wound was then copiously  irrigated with saline solution and the capsule reattached and repaired  with Ethibond suture. 30 ml of .25% Bupivicaine was  injected into the capsule and into the edge of the tensor fascia lata as well as subcutaneous tissue. The fascia overlying the tensor fascia lata was then closed with a running #1 V-Loc. Subcu was closed with interrupted 2-0 Vicryl and subcuticular running 4-0 Monocryl. Incision was cleaned  and dried. Steri-Strips and a bulky sterile dressing applied. Hemovac  drain was hooked to suction and then the patient was awakened and transported to  recovery in stable condition.        Please note that a surgical assistant was a medical necessity for this procedure to perform it in a safe and expeditious manner. Assistant was necessary to provide appropriate retraction of vital neurovascular structures and to prevent femoral fracture and allow for anatomic placement of the prosthesis.  Gaynelle Arabian, M.D.

## 2018-12-20 NOTE — Anesthesia Procedure Notes (Signed)
Procedure Name: MAC Date/Time: 12/20/2018 10:37 AM Performed by: Niel Hummer, CRNA Pre-anesthesia Checklist: Patient identified, Emergency Drugs available, Suction available and Patient being monitored Patient Re-evaluated:Patient Re-evaluated prior to induction Oxygen Delivery Method: Simple face mask

## 2018-12-20 NOTE — Anesthesia Postprocedure Evaluation (Signed)
Anesthesia Post Note  Patient: Jennifer Russell  Procedure(s) Performed: TOTAL HIP ARTHROPLASTY ANTERIOR APPROACH (Left Hip)     Patient location during evaluation: PACU Anesthesia Type: Spinal Level of consciousness: awake and alert Pain management: pain level controlled Vital Signs Assessment: post-procedure vital signs reviewed and stable Respiratory status: spontaneous breathing, nonlabored ventilation and respiratory function stable Cardiovascular status: blood pressure returned to baseline and stable Postop Assessment: no apparent nausea or vomiting and spinal receding Anesthetic complications: no    Last Vitals:  Vitals:   12/20/18 1300 12/20/18 1315  BP: 117/61 136/76  Pulse: (!) 58 60  Resp: 11 13  Temp:  (!) 36.4 C  SpO2: 100% 100%    Last Pain:  Vitals:   12/20/18 1315  TempSrc:   PainSc: 3                  Kaylyn Layer

## 2018-12-20 NOTE — Anesthesia Procedure Notes (Signed)
Spinal  Start time: 12/20/2018 10:39 AM End time: 12/20/2018 10:44 AM Staffing Resident/CRNA: Nelle Don, CRNA Performed: resident/CRNA  Preanesthetic Checklist Completed: patient identified, surgical consent, pre-op evaluation, timeout performed, IV checked, risks and benefits discussed and monitors and equipment checked Spinal Block Patient position: sitting Prep: DuraPrep Patient monitoring: heart rate, cardiac monitor and blood pressure Approach: midline Location: L2-3 Injection technique: single-shot Needle Needle type: Pencan  Needle gauge: 24 G

## 2018-12-20 NOTE — Evaluation (Signed)
Physical Therapy Evaluation Patient Details Name: Jennifer Russell MRN: [id number] DOB: August 08, 1951 Today's Date: 12/20/2018   History of Present Illness  68 yo female s/p L DA-THA on 12/20/18. PMH includes OA, MVP, HTN, PVD, PE, L and R TKR.   Clinical Impression  Pt presents with L hip pain, difficulty performing bed mobility, increased time and effort to perform mobility tasks, and decreased activity tolerance due to L hip pain. Pt to benefit from acute PT to address deficits. Pt ambulated 100 ft with RW with min guard assist this session, verbal cuing for safety and form provided throughout. Pt educated on ankle pumps (20/hour) to perform this afternoon/evening to increase circulation, to pt's tolerance and limited by pain. PT to progress mobility as tolerated, and will continue to follow acutely.        Follow Up Recommendations Follow surgeon's recommendation for DC plan and follow-up therapies;Supervision for mobility/OOB    Equipment Recommendations  None recommended by PT    Recommendations for Other Services       Precautions / Restrictions Precautions Precautions: Fall Restrictions Weight Bearing Restrictions: No Other Position/Activity Restrictions: WBAT      Mobility  Bed Mobility Overal bed mobility: Needs Assistance Bed Mobility: Supine to Sit     Supine to sit: Min assist;HOB elevated     General bed mobility comments: Min assist for LLE lifting and translation to EOB. Verbal cuing for sequencing, increased time and effort.  Transfers Overall transfer level: Needs assistance Equipment used: Rolling walker (2 wheeled) Transfers: Sit to/from Stand Sit to Stand: From elevated surface;Min guard         General transfer comment: Min guard for safety, verbal cuing for hand placement when rising. Pt with self-steadying upon standing.  Ambulation/Gait Ambulation/Gait assistance: Min guard;+2 safety/equipment Gait Distance (Feet): 100 Feet Assistive device:  Rolling walker (2 wheeled) Gait Pattern/deviations: Step-through pattern;Decreased stride length;Trunk flexed Gait velocity: decr    General Gait Details: Min guard for safety, Verbal cuing for turning with RW and sequencing although pt progressing quickly to step-through gait.   Stairs            Wheelchair Mobility    Modified Rankin (Stroke Patients Only)       Balance Overall balance assessment: Mild deficits observed, not formally tested;History of Falls(Pt reports falling in shower after knee replacement last time)                                           Pertinent Vitals/Pain Pain Assessment: 0-10 Pain Score: 4  Pain Location: L hip Pain Descriptors / Indicators: Sore Pain Intervention(s): Monitored during session;Repositioned;Ice applied;Limited activity within patient's tolerance    Home Living Family/patient expects to be discharged to:: Private residence Living Arrangements: Spouse/significant other Available Help at Discharge: Family;Available 24 hours/day Type of Home: House Home Access: Stairs to enter Entrance Stairs-Rails: Left Entrance Stairs-Number of Steps: 2 Home Layout: Two level;Bed/bath upstairs Home Equipment: Walker - 2 wheels;Shower seat      Prior Function Level of Independence: Independent               Hand Dominance   Dominant Hand: Right    Extremity/Trunk Assessment   Upper Extremity Assessment Upper Extremity Assessment: Overall WFL for tasks assessed    Lower Extremity Assessment Lower Extremity Assessment: Overall WFL for tasks assessed;LLE deficits/detail LLE Deficits / Details: suspected post-surgical hip weakness;  able to perform ankle pumps, quad set, heel slide with assist LLE Sensation: WNL;decreased light touch(decreased sensation of gluteal region, mobility unaffected )    Cervical / Trunk Assessment Cervical / Trunk Assessment: Normal  Communication   Communication: No difficulties   Cognition Arousal/Alertness: Awake/alert Behavior During Therapy: WFL for tasks assessed/performed Overall Cognitive Status: Within Functional Limits for tasks assessed                                        General Comments      Exercises     Assessment/Plan    PT Assessment Patient needs continued PT services  PT Problem List Decreased strength;Decreased mobility;Decreased range of motion;Decreased activity tolerance;Decreased balance;Decreased knowledge of use of DME;Pain       PT Treatment Interventions DME instruction;Patient/family education;Balance training;Functional mobility training;Gait training;Therapeutic activities;Therapeutic exercise;Stair training    PT Goals (Current goals can be found in the Care Plan section)  Acute Rehab PT Goals Patient Stated Goal: go home to husband PT Goal Formulation: With patient Time For Goal Achievement: 12/27/18 Potential to Achieve Goals: Good    Frequency 7X/week   Barriers to discharge        Co-evaluation               AM-PAC PT "6 Clicks" Mobility  Outcome Measure Help needed turning from your back to your side while in a flat bed without using bedrails?: A Little Help needed moving from lying on your back to sitting on the side of a flat bed without using bedrails?: A Little Help needed moving to and from a bed to a chair (including a wheelchair)?: A Little Help needed standing up from a chair using your arms (e.g., wheelchair or bedside chair)?: A Little Help needed to walk in hospital room?: A Little Help needed climbing 3-5 steps with a railing? : A Little 6 Click Score: 18    End of Session Equipment Utilized During Treatment: Gait belt Activity Tolerance: Patient tolerated treatment well;Patient limited by pain Patient left: in chair;with chair alarm set;with call bell/phone within reach;with SCD's reapplied Nurse Communication: Mobility status PT Visit Diagnosis: Other abnormalities  of gait and mobility (R26.89);Muscle weakness (generalized) (M62.81)    Time: 1635-1700 PT Time Calculation (min) (ACUTE ONLY): 25 min   Charges:   PT Evaluation $PT Eval Low Complexity: 1 Low PT Treatments $Gait Training: 8-22 mins       Nicola PoliceAlexa D Austyn Seier, PT Acute Rehabilitation Services Pager [phone]  Office [phone]   Maize Brittingham D Despina Hiddenure 12/20/2018, 5:20 PM

## 2018-12-20 NOTE — Discharge Instructions (Addendum)
°Dr. Frank Aluisio °Total Joint Specialist °[facility] °[address] °[place], [place] [address] °[phone] ° °ANTERIOR APPROACH TOTAL HIP REPLACEMENT POSTOPERATIVE DIRECTIONS ° ° °Hip Rehabilitation, Guidelines Following Surgery  °The results of a hip operation are greatly improved after range of motion and muscle strengthening exercises. Follow all safety measures which are given to protect your hip. If any of these exercises cause increased pain or swelling in your joint, decrease the amount until you are comfortable again. Then slowly increase the exercises. Call your caregiver if you have problems or questions.  ° °HOME CARE INSTRUCTIONS  °• Remove items at home which could result in a fall. This includes throw rugs or furniture in walking pathways.  °· ICE to the affected hip every three hours for 30 minutes at a time and then as needed for pain and swelling.  Continue to use ice on the hip for pain and swelling from surgery. You may notice swelling that will progress down to the foot and ankle.  This is normal after surgery.  Elevate the leg when you are not up walking on it.   °· Continue to use the breathing machine which will help keep your temperature down.  It is common for your temperature to cycle up and down following surgery, especially at night when you are not up moving around and exerting yourself.  The breathing machine keeps your lungs expanded and your temperature down. ° °DIET °You may resume your previous home diet once your are discharged from the hospital. ° °DRESSING / WOUND CARE / SHOWERING °You may change your dressing 3-5 days after surgery.  Then change the dressing every day with sterile gauze.  Please use good hand washing techniques before changing the dressing.  Do not use any lotions or creams on the incision until instructed by your surgeon. °You may start showering once you are discharged home but do not submerge the incision under water. Just pat the  incision dry and apply a dry gauze dressing on daily. °Change the surgical dressing daily and reapply a dry dressing each time. ° °ACTIVITY °Walk with your walker as instructed. °Use walker as long as suggested by your caregivers. °Avoid periods of inactivity such as sitting longer than an hour when not asleep. This helps prevent blood clots.  °You may resume a sexual relationship in one month or when given the OK by your doctor.  °You may return to work once you are cleared by your doctor.  °Do not drive a car for 6 weeks or until released by you surgeon.  °Do not drive while taking narcotics. ° °WEIGHT BEARING °Weight bearing as tolerated with assist device (walker, cane, etc) as directed, use it as long as suggested by your surgeon or therapist, typically at least 4-6 weeks. ° °POSTOPERATIVE CONSTIPATION PROTOCOL °Constipation - defined medically as fewer than three stools per week and severe constipation as less than one stool per week. ° °One of the most common issues patients have following surgery is constipation.  Even if you have a regular bowel pattern at home, your normal regimen is likely to be disrupted due to multiple reasons following surgery.  Combination of anesthesia, postoperative narcotics, change in appetite and fluid intake all can affect your bowels.  In order to avoid complications following surgery, here are some recommendations in order to help you during your recovery period. ° °Colace (docusate) - Pick up an over-the-counter form of Colace or another stool softener and take twice a day   as long as you are requiring postoperative pain medications.  Take with a full glass of water daily.  If you experience loose stools or diarrhea, hold the colace until you stool forms back up.  If your symptoms do not get better within 1 week or if they get worse, check with your doctor. ° °Dulcolax (bisacodyl) - Pick up over-the-counter and take as directed by the product packaging as needed to assist with  the movement of your bowels.  Take with a full glass of water.  Use this product as needed if not relieved by Colace only.  ° °MiraLax (polyethylene glycol) - Pick up over-the-counter to have on hand.  MiraLax is a solution that will increase the amount of water in your bowels to assist with bowel movements.  Take as directed and can mix with a glass of water, juice, soda, coffee, or tea.  Take if you go more than two days without a movement. °Do not use MiraLax more than once per day. Call your doctor if you are still constipated or irregular after using this medication for 7 days in a row. ° °If you continue to have problems with postoperative constipation, please contact the office for further assistance and recommendations.  If you experience "the worst abdominal pain ever" or develop nausea or vomiting, please contact the office immediatly for further recommendations for treatment. ° °ITCHING ° If you experience itching with your medications, try taking only a single pain pill, or even half a pain pill at a time.  You can also use Benadryl over the counter for itching or also to help with sleep.  ° °TED HOSE STOCKINGS °Wear the elastic stockings on both legs for three weeks following surgery during the day but you may remove then at night for sleeping. ° °MEDICATIONS °See your medication summary on the “After Visit Summary” that the nursing staff will review with you prior to discharge.  You may have some home medications which will be placed on hold until you complete the course of blood thinner medication.  It is important for you to complete the blood thinner medication as prescribed by your surgeon.  Continue your approved medications as instructed at time of discharge. ° °PRECAUTIONS °If you experience chest pain or shortness of breath - call 911 immediately for transfer to the hospital emergency department.  °If you develop a fever greater that 101 F, purulent drainage from wound, increased redness or  drainage from wound, foul odor from the wound/dressing, or calf pain - CONTACT YOUR SURGEON.   °                                                °FOLLOW-UP APPOINTMENTS °Make sure you keep all of your appointments after your operation with your surgeon and caregivers. You should call the office at the above phone number and make an appointment for approximately two weeks after the date of your surgery or on the date instructed by your surgeon outlined in the "After Visit Summary". ° °RANGE OF MOTION AND STRENGTHENING EXERCISES  °These exercises are designed to help you keep full movement of your hip joint. Follow your caregiver's or physical therapist's instructions. Perform all exercises about fifteen times, three times per day or as directed. Exercise both hips, even if you have had only one joint replacement. These exercises can be done on   a training (exercise) mat, on the floor, on a table or on a bed. Use whatever works the best and is most comfortable for you. Use music or television while you are exercising so that the exercises are a pleasant break in your day. This will make your life better with the exercises acting as a break in routine you can look forward to.  °• Lying on your back, slowly slide your foot toward your buttocks, raising your knee up off the floor. Then slowly slide your foot back down until your leg is straight again.  °• Lying on your back spread your legs as far apart as you can without causing discomfort.  °• Lying on your side, raise your upper leg and foot straight up from the floor as far as is comfortable. Slowly lower the leg and repeat.  °• Lying on your back, tighten up the muscle in the front of your thigh (quadriceps muscles). You can do this by keeping your leg straight and trying to raise your heel off the floor. This helps strengthen the largest muscle supporting your knee.  °• Lying on your back, tighten up the muscles of your buttocks both with the legs straight and with  the knee bent at a comfortable angle while keeping your heel on the floor.  ° °IF YOU ARE TRANSFERRED TO A SKILLED REHAB FACILITY °If the patient is transferred to a skilled rehab facility following release from the hospital, a list of the current medications will be sent to the facility for the patient to continue.  When discharged from the skilled rehab facility, please have the facility set up the patient's Home Health Physical Therapy prior to being released. Also, the skilled facility will be responsible for providing the patient with their medications at time of release from the facility to include their pain medication, the muscle relaxants, and their blood thinner medication. If the patient is still at the rehab facility at time of the two week follow up appointment, the skilled rehab facility will also need to assist the patient in arranging follow up appointment in our office and any transportation needs. ° °MAKE SURE YOU:  °• Understand these instructions.  °• Get help right away if you are not doing well or get worse.  ° ° °Pick up stool softner and laxative for home use following surgery while on pain medications. °Do not submerge incision under water. °Please use good hand washing techniques while changing dressing each day. °May shower starting three days after surgery. °Please use a clean towel to pat the incision dry following showers. °Continue to use ice for pain and swelling after surgery. °Do not use any lotions or creams on the incision until instructed by your surgeon. ° °Information on my medicine - XARELTO® (Rivaroxaban) ° °This medication education was reviewed with me or my healthcare representative as part of my discharge preparation.  The pharmacist that spoke with me during my hospital stay was:  Jolynne Spurgin A, RPH ° °Why was Xarelto® prescribed for you? °Xarelto® was prescribed for you to reduce the risk of blood clots forming after orthopedic surgery. The medical term for these  abnormal blood clots is venous thromboembolism (VTE). ° °What do you need to know about xarelto® ? °Take your Xarelto® ONCE DAILY at the same time every day. °You may take it either with or without food. ° °If you have difficulty swallowing the tablet whole, you may crush it and mix in applesauce just prior to taking your dose. ° °  Take Xarelto® exactly as prescribed by your doctor and DO NOT stop taking Xarelto® without talking to the doctor who prescribed the medication.  Stopping without other VTE prevention medication to take the place of Xarelto® may increase your risk of developing a clot. ° °After discharge, you should have regular check-up appointments with your healthcare provider that is prescribing your Xarelto®.   ° °What do you do if you miss a dose? °If you miss a dose, take it as soon as you remember on the same day then continue your regularly scheduled once daily regimen the next day. Do not take two doses of Xarelto® on the same day.  ° °Important Safety Information °A possible side effect of Xarelto® is bleeding. You should call your healthcare provider right away if you experience any of the following: °? Bleeding from an injury or your nose that does not stop. °? Unusual colored urine (red or dark brown) or unusual colored stools (red or black). °? Unusual bruising for unknown reasons. °? A serious fall or if you hit your head (even if there is no bleeding). ° °Some medicines may interact with Xarelto® and might increase your risk of bleeding while on Xarelto®. To help avoid this, consult your healthcare provider or pharmacist prior to using any new prescription or non-prescription medications, including herbals, vitamins, non-steroidal anti-inflammatory drugs (NSAIDs) and supplements. ° °This website has more information on Xarelto®: www.xarelto.com. ° ° ° ° °

## 2018-12-21 ENCOUNTER — Encounter (HOSPITAL_COMMUNITY): Payer: Self-pay | Admitting: Orthopedic Surgery

## 2018-12-21 DIAGNOSIS — Z96651 Presence of right artificial knee joint: Secondary | ICD-10-CM | POA: Diagnosis not present

## 2018-12-21 DIAGNOSIS — M1612 Unilateral primary osteoarthritis, left hip: Secondary | ICD-10-CM | POA: Diagnosis not present

## 2018-12-21 DIAGNOSIS — G43909 Migraine, unspecified, not intractable, without status migrainosus: Secondary | ICD-10-CM | POA: Diagnosis not present

## 2018-12-21 DIAGNOSIS — N183 Chronic kidney disease, stage 3 (moderate): Secondary | ICD-10-CM | POA: Diagnosis not present

## 2018-12-21 DIAGNOSIS — I129 Hypertensive chronic kidney disease with stage 1 through stage 4 chronic kidney disease, or unspecified chronic kidney disease: Secondary | ICD-10-CM | POA: Diagnosis not present

## 2018-12-21 DIAGNOSIS — I739 Peripheral vascular disease, unspecified: Secondary | ICD-10-CM | POA: Diagnosis not present

## 2018-12-21 LAB — BASIC METABOLIC PANEL
Anion gap: 8 (ref 5–15)
BUN: 14 mg/dL (ref 8–23)
CO2: 25 mmol/L (ref 22–32)
Calcium: 9.2 mg/dL (ref 8.9–10.3)
Chloride: 107 mmol/L (ref 98–111)
Creatinine, Ser: 0.78 mg/dL (ref 0.44–1.00)
GFR calc Af Amer: >60 mL/min (ref >60)
GFR calc non Af Amer: >60 mL/min (ref >60)
Glucose, Bld: 131 mg/dL — ABNORMAL HIGH (ref 70–99)
Potassium: 4.8 mmol/L (ref 3.5–5.1)
Sodium: 140 mmol/L (ref 135–145)

## 2018-12-21 LAB — CBC
HCT: 39.7 % (ref 36.0–46.0)
Hemoglobin: 12.9 g/dL (ref 12.0–15.0)
MCH: 31.5 pg (ref 26.0–34.0)
MCHC: 32.5 g/dL (ref 30.0–36.0)
MCV: 97.1 fL (ref 80.0–100.0)
Platelets: 179 10*3/uL (ref 150–400)
RBC: 4.09 MIL/uL (ref 3.87–5.11)
RDW: 12.4 % (ref 11.5–15.5)
WBC: 13.3 10*3/uL — ABNORMAL HIGH (ref 4.0–10.5)
nRBC: 0 % (ref 0.0–0.2)

## 2018-12-21 MED ORDER — RIVAROXABAN 10 MG PO TABS: 10.0000 mg | ORAL_TABLET | Freq: Every day | ORAL | 0 refills | Status: AC

## 2018-12-21 MED ORDER — TRAMADOL HCL 50 MG PO TABS: 50.0000 mg | ORAL_TABLET | Freq: Four times a day (QID) | ORAL | 0 refills | Status: AC | PRN

## 2018-12-21 MED ORDER — METHOCARBAMOL 500 MG PO TABS: 500.0000 mg | ORAL_TABLET | Freq: Four times a day (QID) | ORAL | 0 refills | Status: AC | PRN

## 2018-12-21 MED ORDER — HYDROCODONE-ACETAMINOPHEN 5-325 MG PO TABS: 1.0000 | ORAL_TABLET | Freq: Four times a day (QID) | ORAL | 0 refills | Status: AC | PRN

## 2018-12-21 NOTE — Progress Notes (Signed)
Patient pulled out hemovac around 10pm   Stated she did not know how it happened.  No bleeding noted at the site and amount out of hemovac was 8cc.

## 2018-12-21 NOTE — Progress Notes (Signed)
Physical Therapy Treatment Patient Details Name: Jennifer Russell MRN: [id number] DOB: 1951/05/17 Today's Date: 12/21/2018    History of Present Illness 68 yo female s/p L DA-THA on 12/20/18. PMH includes OA, MVP, HTN, PVD, PE, L and R TKR.     PT Comments    POD# 1 Pt had had 2 previous TKR and is very knowledgeable. Assisted with amb in hallway, practiced stairs.  Then returned to room to perform some TE's following HEP handout.  Instructed on proper tech, freq as well as use of ICE.   Addressed all mobility questions, discussed appropriate activity, educated on use of ICE.  Pt ready for D/C to home.   Follow Up Recommendations  Follow surgeon's recommendation for DC plan and follow-up therapies;Supervision for mobility/OOB(HEP)     Equipment Recommendations  None recommended by PT    Recommendations for Other Services       Precautions / Restrictions Precautions Precautions: Fall Restrictions Weight Bearing Restrictions: No Other Position/Activity Restrictions: WBAT    Mobility  Bed Mobility               General bed mobility comments: OOB in recliner   Transfers Overall transfer level: Needs assistance Equipment used: Rolling walker (2 wheeled) Transfers: Sit to/from Stand Sit to Stand: From elevated surface;Min guard         General transfer comment: Min guard for safety, verbal cuing for hand placement when rising. Pt with self-steadying upon standing.  Ambulation/Gait Ambulation/Gait assistance: Supervision;Min guard Gait Distance (Feet): 115 Feet Assistive device: Rolling walker (2 wheeled) Gait Pattern/deviations: Step-through pattern;Decreased stride length;Trunk flexed Gait velocity: decr    General Gait Details: Min guard for safety, Verbal cuing for turning with RW and sequencing although pt progressing quickly to step-through gait.    Stairs Stairs: Yes Stairs assistance: Min guard;Supervision Stair Management: One rail Left;Step to  pattern;Sideways Number of Stairs: 3 General stair comments: tolerated well   Wheelchair Mobility    Modified Rankin (Stroke Patients Only)       Balance                                            Cognition Arousal/Alertness: Awake/alert Behavior During Therapy: WFL for tasks assessed/performed Overall Cognitive Status: Within Functional Limits for tasks assessed                                        Exercises   Total Hip Replacement TE's 10 reps ankle pumps 10 reps knee presses 10 reps heel slides 10 reps SAQ's 10 reps ABD Followed by ICE     General Comments        Pertinent Vitals/Pain Pain Assessment: 0-10 Pain Score: 6  Pain Location: L hip Pain Descriptors / Indicators: Discomfort;Operative site guarding;Tender Pain Intervention(s): Monitored during session;Repositioned;Patient requesting pain meds-RN notified;Ice applied    Home Living                      Prior Function            PT Goals (current goals can now be found in the care plan section) Progress towards PT goals: Progressing toward goals    Frequency    7X/week      PT Plan Current plan remains appropriate  Co-evaluation              AM-PAC PT "6 Clicks" Mobility   Outcome Measure  Help needed turning from your back to your side while in a flat bed without using bedrails?: A Little Help needed moving from lying on your back to sitting on the side of a flat bed without using bedrails?: A Little Help needed moving to and from a bed to a chair (including a wheelchair)?: A Little Help needed standing up from a chair using your arms (e.g., wheelchair or bedside chair)?: A Little Help needed to walk in hospital room?: A Little Help needed climbing 3-5 steps with a railing? : A Little 6 Click Score: 18    End of Session Equipment Utilized During Treatment: Gait belt Activity Tolerance: Patient tolerated treatment well;Patient  limited by pain Patient left: in chair;with chair alarm set;with call bell/phone within reach;with SCD's reapplied Nurse Communication: (pt ready for D/C to home) PT Visit Diagnosis: Other abnormalities of gait and mobility (R26.89);Muscle weakness (generalized) (M62.81)     Time: 1937-9024 PT Time Calculation (min) (ACUTE ONLY): 32 min  Charges:  $Gait Training: 8-22 mins $Therapeutic Exercise: 8-22 mins                     Felecia Shelling  PTA Acute  Rehabilitation Services Pager      [phone] Office      [phone]

## 2018-12-21 NOTE — TOC Transition Note (Signed)
Transition of Care [facility]) - CM/SW Discharge Note   Patient Details  Name: Shaqunda Bedoya MRN: [id number] Date of Birth: [birth year]  [facility] CM/SW Contact:  Armanda Heritage, RN Phone Number: 12/21/2018, 10:42 AM   Clinical Narrative:   CM spoke with patient, who reports she is going home with HEP, reports has RW at home and requested 3-in-1 for home use. Spoke with Ian Malkin at [facility] and arranged for DME to be delivered to patient for discharge home today.  No further needs identified.     Final next level of care: Home/Self Care Barriers to Discharge: No Barriers Identified   Patient Goals and CMS Choice Patient states their goals for this hospitalization and ongoing recovery are:: to go home      Discharge Placement  Home                     Discharge Plan and Services   Discharge Planning Services: CM Consult            DME Arranged: 3-N-1 DME Agency: [employer] Date DME Agency Contacted: 12/21/18 Time DME Agency Contacted: 1025 Representative spoke with at DME Agency: Ian Malkin             Social Determinants of Health (SDOH) Interventions     Readmission Risk Interventions No flowsheet data found.

## 2018-12-21 NOTE — Care Management Obs Status (Signed)
MEDICARE OBSERVATION STATUS NOTIFICATION   Patient Details  Name: Jennifer Russell MRN: [id number] Date of Birth: 06/18/1951   Medicare Observation Status Notification Given:  Yes    Armanda Heritage, RN 12/21/2018, 10:31 AM

## 2018-12-21 NOTE — Progress Notes (Signed)
   Subjective: 1 Day Post-Op Procedure(s) (LRB): TOTAL HIP ARTHROPLASTY ANTERIOR APPROACH (Left) Patient reports pain as mild.   Patient seen in rounds by Dr. Lequita Halt. Patient is well, and has had no acute complaints or problems. States she is ready to go home. No issues overnight. Denies chest pain or SOB. Foley catheter removed this AM. We will continue therapy today.   Objective: Vital signs in last 24 hours: Temp:  [97.5 F (36.4 C)-97.9 F (36.6 C)] 97.6 F (36.4 C) (05/14 0548) Pulse Rate:  [58-75] 69 (05/14 0548) Resp:  [10-17] 12 (05/14 0600) BP: (111-154)/(61-82) 129/73 (05/14 0548) SpO2:  [95 %-100 %] 100 % (05/14 0548) Weight:  [83.5 kg] 83.5 kg (05/13 1345)  Intake/Output from previous day:  Intake/Output Summary (Last 24 hours) at 12/21/2018 0752 Last data filed at 12/21/2018 0548 Gross per 24 hour  Intake 2952.88 ml  Output 4288 ml  Net -1335.12 ml    Labs: Recent Labs    12/21/18 0330  HGB 12.9   Recent Labs    12/21/18 0330  WBC 13.3*  RBC 4.09  HCT 39.7  PLT 179   Recent Labs    12/21/18 0330  NA 140  K 4.8  CL 107  CO2 25  BUN 14  CREATININE 0.78  GLUCOSE 131*  CALCIUM 9.2   Exam: General - Patient is Alert and Oriented Extremity - Neurologically intact Neurovascular intact Sensation intact distally Dorsiflexion/Plantar flexion intact Dressing - dressing C/D/I Motor Function - intact, moving foot and toes well on exam.   Past Medical History:  Diagnosis Date  . Arthritis   . Headache    migraines  . Heart murmur    MVP  . Heartburn   . Hypertension   . Peripheral vascular disease (HCC)   . Pulmonary embolism (HCC) 05/1984   age 68  . Vasomotor rhinitis     Assessment/Plan: 1 Day Post-Op Procedure(s) (LRB): TOTAL HIP ARTHROPLASTY ANTERIOR APPROACH (Left) Principal Problem:   OA (osteoarthritis) of hip Active Problems:   Osteoarthritis of left hip  Estimated body mass index is 28.82 kg/m as calculated from the  following:   Height as of this encounter: 5\' 7"  (1.702 m).   Weight as of this encounter: 83.5 kg. Advance diet Up with therapy D/C IV fluids  DVT Prophylaxis - Xarelto Weight bearing as tolerated. D/C O2 and pulse ox and try on room air. Hemovac pulled without difficulty, will continue therapy.  Plan is to go Home after hospital stay. Plan for discharge with HEP once meeting goals with therapy. Follow-up in the office in 2 weeks.   Arther Abbott, PA-C Orthopedic Surgery 12/21/2018, 7:52 AM

## 2018-12-25 NOTE — Discharge Summary (Signed)
Physician Discharge Summary   Patient ID: Jennifer Russell MRN: [id number] DOB/AGE: [birth year] 66 y.o.  Admit date: 12/20/2018 Discharge date: 12/21/2018  Primary Diagnosis: Osteoarthritis, left hip   Admission Diagnoses:  Past Medical History:  Diagnosis Date   Arthritis    Headache    migraines   Heart murmur    MVP   Heartburn    Hypertension    Peripheral vascular disease (HCC)    Pulmonary embolism (HCC) 05/1984   age 89   Vasomotor rhinitis    Discharge Diagnoses:   Principal Problem:   OA (osteoarthritis) of hip Active Problems:   Osteoarthritis of left hip  Estimated body mass index is 28.82 kg/m as calculated from the following:   Height as of this encounter: 5\' 7"  (1.702 m).   Weight as of this encounter: 83.5 kg.  Procedure:  Procedure(s) (LRB): TOTAL HIP ARTHROPLASTY ANTERIOR APPROACH (Left)   Consults: None  HPI: Jennifer Russell is a [age] y.o. female who has advanced end-stage arthritis of their Left  hip with progressively worsening pain and dysfunction.The patient has failed nonoperative management and presents for total hip arthroplasty.   Laboratory Data: Admission on 12/20/2018, Discharged on 12/21/2018  Component Date Value Ref Range Status   WBC 12/21/2018 13.3* 4.0 - 10.5 K/uL Final   RBC 12/21/2018 4.09  3.87 - 5.11 MIL/uL Final   Hemoglobin 12/21/2018 12.9  12.0 - 15.0 g/dL Final   HCT 40/98/119105/14/2020 39.7  36.0 - 46.0 % Final   MCV 12/21/2018 97.1  80.0 - 100.0 fL Final   MCH 12/21/2018 31.5  26.0 - 34.0 pg Final   MCHC 12/21/2018 32.5  30.0 - 36.0 g/dL Final   RDW 47/82/956205/14/2020 12.4  11.5 - 15.5 % Final   Platelets 12/21/2018 179  150 - 400 K/uL Final   nRBC 12/21/2018 0.0  0.0 - 0.2 % Final   Performed at [facility], [address] [place], [place] [address]   Sodium 12/21/2018 140  135 - 145 mmol/L Final   Potassium 12/21/2018 4.8  3.5 - 5.1 mmol/L Final   Chloride 12/21/2018 107  98 - 111 mmol/L  Final   CO2 12/21/2018 25  22 - 32 mmol/L Final   Glucose, Bld 12/21/2018 131* 70 - 99 mg/dL Final   BUN 57/84/696205/14/2020 14  8 - 23 mg/dL Final   Creatinine, Ser 12/21/2018 0.78  0.44 - 1.00 mg/dL Final   Calcium 95/28/413205/14/2020 9.2  8.9 - 10.3 mg/dL Final   GFR calc non Af Amer 12/21/2018 >60  >60 mL/min Final   GFR calc Af Amer 12/21/2018 >60  >60 mL/min Final   Anion gap 12/21/2018 8  5 - 15 Final   Performed at [facility], [address] [place], [place] [address]  Hospital Outpatient Visit on 12/15/2018  Component Date Value Ref Range Status   SARS-CoV-2, NAA 12/15/2018 NOT DETECTED  NOT DETECTED Final   Comment: (NOTE) This test was developed and its performance characteristics determined by [facility]. This test has not been FDA cleared or approved. This test has been authorized by FDA under an Emergency Use Authorization (EUA). This test is only authorized for the duration of time the declaration that circumstances exist justifying the authorization of the emergency use of in vitro diagnostic tests for detection of SARS-CoV-2 virus and/or diagnosis of COVID-19 infection under section 564(b)(1) of the Act, 21 U.S.C. 272ZDG-6(Y)(4360bbb-3(b)(1), unless the authorization is terminated or revoked sooner. When diagnostic testing is negative, the  possibility of a false negative result should be considered in the context of a patient's recent exposures and the presence of clinical signs and symptoms consistent with COVID-19. An individual without symptoms of COVID-19 and who is not shedding SARS-CoV-2 virus would expect to have a negative (not detected) result in this assay. Performed                           At: [facility] [address] [place], [place] [address] Jolene Schimke MD WU:[id number]    Coronavirus Source 12/15/2018 NASOPHARYNGEAL   Final   Performed at [facility], [address] [place], [place] [address]  Hospital Outpatient  Visit on 12/15/2018  Component Date Value Ref Range Status   aPTT 12/15/2018 29  24 - 36 seconds Final   Performed at [facility], [address] [place], [place] [address]   WBC 12/15/2018 3.9* 4.0 - 10.5 K/uL Final   RBC 12/15/2018 4.49  3.87 - 5.11 MIL/uL Final   Hemoglobin 12/15/2018 13.6  12.0 - 15.0 g/dL Final   HCT 78/46/9629 42.9  36.0 - 46.0 % Final   MCV 12/15/2018 95.5  80.0 - 100.0 fL Final   MCH 12/15/2018 30.3  26.0 - 34.0 pg Final   MCHC 12/15/2018 31.7  30.0 - 36.0 g/dL Final   RDW 52/84/1324 12.2  11.5 - 15.5 % Final   Platelets 12/15/2018 180  150 - 400 K/uL Final   nRBC 12/15/2018 0.0  0.0 - 0.2 % Final   Performed at [facility], [address], [place], [place] [address]   Sodium 12/15/2018 139  135 - 145 mmol/L Final   Potassium 12/15/2018 4.1  3.5 - 5.1 mmol/L Final   Chloride 12/15/2018 106  98 - 111 mmol/L Final   CO2 12/15/2018 26  22 - 32 mmol/L Final   Glucose, Bld 12/15/2018 80  70 - 99 mg/dL Final   BUN 72/53/6644 21  8 - 23 mg/dL Final   Creatinine, Ser 12/15/2018 0.94  0.44 - 1.00 mg/dL Final   Calcium 03/47/4259 9.1  8.9 - 10.3 mg/dL Final   Total Protein 56/38/7564 6.4* 6.5 - 8.1 g/dL Final   Albumin 33/29/5188 4.2  3.5 - 5.0 g/dL Final   AST 41/66/0630 24  15 - 41 U/L Final   ALT 12/15/2018 33  0 - 44 U/L Final   Alkaline Phosphatase 12/15/2018 73  38 - 126 U/L Final   Total Bilirubin 12/15/2018 0.6  0.3 - 1.2 mg/dL Final   GFR calc non Af Amer 12/15/2018 >60  >60 mL/min Final   GFR calc Af Amer 12/15/2018 >60  >60 mL/min Final   Anion gap 12/15/2018 7  5 - 15 Final   Performed at [facility], [address], [place], [place] [address]   Prothrombin Time 12/15/2018 13.8  11.4 - 15.2 seconds Final   INR 12/15/2018 1.1  0.8 - 1.2 Final   Comment: (NOTE) INR goal varies based on device and disease states. Performed at [facility], [address], [place], [place] [address]    ABO/RH(D) 12/15/2018 A POS   Final   Antibody Screen 12/15/2018 NEG   Final   Sample Expiration 12/15/2018 12/23/2018,2359   Final   Extend sample reason 12/15/2018    Final                   Value:NO TRANSFUSIONS OR  PREGNANCY IN THE PAST 3 MONTHS Performed at [facility], [address], [place], [place] [address]    MRSA, PCR 12/15/2018 NEGATIVE  NEGATIVE Final   Staphylococcus aureus 12/15/2018 NEGATIVE  NEGATIVE Final   Comment: (NOTE) The Xpert SA Assay (FDA approved for NASAL specimens in patients 58 years of age and older), is one component of a comprehensive surveillance program. It is not intended to diagnose infection nor to guide or monitor treatment. Performed at [facility], [address], [place], [place] [address]      X-Rays:Dg Pelvis 1-2 Views  Result Date: 12/20/2018 CLINICAL DATA:  Post left hip replacement EXAM: PELVIS - 1-2 VIEW COMPARISON:  None. FINDINGS: Changes of left hip replacement. Normal AP alignment. No hardware or bony complicating feature. Soft tissue drain in place. IMPRESSION: Left hip replacement.  No visible complicating feature. Electronically Signed   By: Charlett Nose M.D.   On: 12/20/2018 13:34   Dg C-arm 1-60 Min-no Report  Result Date: 12/20/2018 CLINICAL DATA:  68 year old female with left hip replacement EXAM: OPERATIVE LEFT HIP (WITH PELVIS IF PERFORMED)  VIEWS TECHNIQUE: Fluoroscopic spot image(s) were submitted for interpretation post-operatively. COMPARISON:  None. FINDINGS: Single intraoperative fluoroscopic spot images of the pelvis. Surgical changes of left hip arthroplasty. IMPRESSION: Limited intraoperative fluoroscopic spot images demonstrating left hip arthroplasty Please refer to the dictated operative report for full details of intraoperative findings and procedure. Electronically Signed   By: Gilmer Mor D.O.   On: 12/20/2018 12:50   Dg Hip  Operative Unilat With Pelvis Left  Result Date: 12/20/2018 CLINICAL DATA:  68 year old female with left hip replacement EXAM: OPERATIVE LEFT HIP (WITH PELVIS IF PERFORMED)  VIEWS TECHNIQUE: Fluoroscopic spot image(s) were submitted for interpretation post-operatively. COMPARISON:  None. FINDINGS: Single intraoperative fluoroscopic spot images of the pelvis. Surgical changes of left hip arthroplasty. IMPRESSION: Limited intraoperative fluoroscopic spot images demonstrating left hip arthroplasty Please refer to the dictated operative report for full details of intraoperative findings and procedure. Electronically Signed   By: Gilmer Mor D.O.   On: 12/20/2018 12:50    EKG:No orders found for this or any previous visit.   Hospital Course: Jennifer Russell is a 68 y.o. who was admitted to [facility]. They were brought to the operating room on 12/20/2018 and underwent Procedure(s): TOTAL HIP ARTHROPLASTY ANTERIOR APPROACH.  Patient tolerated the procedure well and was later transferred to the recovery room and then to the orthopaedic floor for postoperative care. They were given PO and IV analgesics for pain control following their surgery. They were given 24 hours of postoperative antibiotics of  Anti-infectives (From admission, onward)   Start     Dose/Rate Route Frequency Ordered Stop   12/20/18 1730  ceFAZolin (ANCEF) IVPB 2g/100 mL premix     2 g 200 mL/hr over 30 Minutes Intravenous Every 6 hours 12/20/18 1354 12/21/18 0023   12/20/18 0915  ceFAZolin (ANCEF) IVPB 2g/100 mL premix     2 g 200 mL/hr over 30 Minutes Intravenous On call to O.R. 12/20/18 0981 12/20/18 1114     and started on DVT prophylaxis in the form of Xarelto.   PT and OT were ordered for total joint protocol. Discharge planning consulted to help with postop disposition and equipment needs.  Patient had a good night on the evening of surgery. They started to get up OOB with therapy on POD #0. Pt was seen during rounds  and was ready to go home pending  progress with therapy. Hemovac drain was pulled without difficulty. She worked with therapy on POD #1 and was meeting her goals. Pt was discharged to home later that day in stable condition.  Diet: Regular diet Activity: WBAT Follow-up: in 2 weeks Disposition: Home with HEP Discharged Condition: stable   Discharge Instructions    Call MD / Call 911   Complete by:  As directed    If you experience chest pain or shortness of breath, CALL 911 and be transported to the hospital emergency room.  If you develope a fever above 101 F, pus (white drainage) or increased drainage or redness at the wound, or calf pain, call your surgeon's office.   Change dressing   Complete by:  As directed    You may change your dressing on Friday, then change the dressing daily with sterile 4 x 4 inch gauze dressing and paper tape.   Constipation Prevention   Complete by:  As directed    Drink plenty of fluids.  Prune juice may be helpful.  You may use a stool softener, such as Colace (over the counter) 100 mg twice a day.  Use MiraLax (over the counter) for constipation as needed.   Diet - low sodium heart healthy   Complete by:  As directed    Discharge instructions   Complete by:  As directed    Dr. Ollen Gross Total Joint Specialist [facility] [address] [place], [place] [address] [phone]  ANTERIOR APPROACH TOTAL HIP REPLACEMENT POSTOPERATIVE DIRECTIONS   Hip Rehabilitation, Guidelines Following Surgery  The results of a hip operation are greatly improved after range of motion and muscle strengthening exercises. Follow all safety measures which are given to protect your hip. If any of these exercises cause increased pain or swelling in your joint, decrease the amount until you are comfortable again. Then slowly increase the exercises. Call your caregiver if you have problems or questions.   HOME CARE INSTRUCTIONS  Remove items at home which  could result in a fall. This includes throw rugs or furniture in walking pathways.  ICE to the affected hip every three hours for 30 minutes at a time and then as needed for pain and swelling.  Continue to use ice on the hip for pain and swelling from surgery. You may notice swelling that will progress down to the foot and ankle.  This is normal after surgery.  Elevate the leg when you are not up walking on it.   Continue to use the breathing machine which will help keep your temperature down.  It is common for your temperature to cycle up and down following surgery, especially at night when you are not up moving around and exerting yourself.  The breathing machine keeps your lungs expanded and your temperature down.  DIET You may resume your previous home diet once your are discharged from the hospital.  DRESSING / WOUND CARE / SHOWERING You may change your dressing 3-5 days after surgery.  Then change the dressing every day with sterile gauze.  Please use good hand washing techniques before changing the dressing.  Do not use any lotions or creams on the incision until instructed by your surgeon. You may start showering once you are discharged home but do not submerge the incision under water. Just pat the incision dry and apply a dry gauze dressing on daily. Change the surgical dressing daily and reapply a dry dressing each time.  ACTIVITY Walk with your walker as instructed.  Use walker as long as suggested by your caregivers. Avoid periods of inactivity such as sitting longer than an hour when not asleep. This helps prevent blood clots.  You may resume a sexual relationship in one month or when given the OK by your doctor.  You may return to work once you are cleared by your doctor.  Do not drive a car for 6 weeks or until released by you surgeon.  Do not drive while taking narcotics.  WEIGHT BEARING Weight bearing as tolerated with assist device (walker, cane, etc) as directed, use it as  long as suggested by your surgeon or therapist, typically at least 4-6 weeks.  POSTOPERATIVE CONSTIPATION PROTOCOL Constipation - defined medically as fewer than three stools per week and severe constipation as less than one stool per week.  One of the most common issues patients have following surgery is constipation.  Even if you have a regular bowel pattern at home, your normal regimen is likely to be disrupted due to multiple reasons following surgery.  Combination of anesthesia, postoperative narcotics, change in appetite and fluid intake all can affect your bowels.  In order to avoid complications following surgery, here are some recommendations in order to help you during your recovery period.  Colace (docusate) - Pick up an over-the-counter form of Colace or another stool softener and take twice a day as long as you are requiring postoperative pain medications.  Take with a full glass of water daily.  If you experience loose stools or diarrhea, hold the colace until you stool forms back up.  If your symptoms do not get better within 1 week or if they get worse, check with your doctor.  Dulcolax (bisacodyl) - Pick up over-the-counter and take as directed by the product packaging as needed to assist with the movement of your bowels.  Take with a full glass of water.  Use this product as needed if not relieved by Colace only.   MiraLax (polyethylene glycol) - Pick up over-the-counter to have on hand.  MiraLax is a solution that will increase the amount of water in your bowels to assist with bowel movements.  Take as directed and can mix with a glass of water, juice, soda, coffee, or tea.  Take if you go more than two days without a movement. Do not use MiraLax more than once per day. Call your doctor if you are still constipated or irregular after using this medication for 7 days in a row.  If you continue to have problems with postoperative constipation, please contact the office for further  assistance and recommendations.  If you experience "the worst abdominal pain ever" or develop nausea or vomiting, please contact the office immediatly for further recommendations for treatment.  ITCHING  If you experience itching with your medications, try taking only a single pain pill, or even half a pain pill at a time.  You can also use Benadryl over the counter for itching or also to help with sleep.   TED HOSE STOCKINGS Wear the elastic stockings on both legs for three weeks following surgery during the day but you may remove then at night for sleeping.  MEDICATIONS See your medication summary on the "After Visit Summary" that the nursing staff will review with you prior to discharge.  You may have some home medications which will be placed on hold until you complete the course of blood thinner medication.  It is important for you to complete the blood thinner medication as prescribed by  your surgeon.  Continue your approved medications as instructed at time of discharge.  PRECAUTIONS If you experience chest pain or shortness of breath - call 911 immediately for transfer to the hospital emergency department.  If you develop a fever greater that 101 F, purulent drainage from wound, increased redness or drainage from wound, foul odor from the wound/dressing, or calf pain - CONTACT YOUR SURGEON.                                                   FOLLOW-UP APPOINTMENTS Make sure you keep all of your appointments after your operation with your surgeon and caregivers. You should call the office at the above phone number and make an appointment for approximately two weeks after the date of your surgery or on the date instructed by your surgeon outlined in the "After Visit Summary".  RANGE OF MOTION AND STRENGTHENING EXERCISES  These exercises are designed to help you keep full movement of your hip joint. Follow your caregiver's or physical therapist's instructions. Perform all exercises about  fifteen times, three times per day or as directed. Exercise both hips, even if you have had only one joint replacement. These exercises can be done on a training (exercise) mat, on the floor, on a table or on a bed. Use whatever works the best and is most comfortable for you. Use music or television while you are exercising so that the exercises are a pleasant break in your day. This will make your life better with the exercises acting as a break in routine you can look forward to.  Lying on your back, slowly slide your foot toward your buttocks, raising your knee up off the floor. Then slowly slide your foot back down until your leg is straight again.  Lying on your back spread your legs as far apart as you can without causing discomfort.  Lying on your side, raise your upper leg and foot straight up from the floor as far as is comfortable. Slowly lower the leg and repeat.  Lying on your back, tighten up the muscle in the front of your thigh (quadriceps muscles). You can do this by keeping your leg straight and trying to raise your heel off the floor. This helps strengthen the largest muscle supporting your knee.  Lying on your back, tighten up the muscles of your buttocks both with the legs straight and with the knee bent at a comfortable angle while keeping your heel on the floor.   IF YOU ARE TRANSFERRED TO A SKILLED REHAB FACILITY If the patient is transferred to a skilled rehab facility following release from the hospital, a list of the current medications will be sent to the facility for the patient to continue.  When discharged from the skilled rehab facility, please have the facility set up the patient's Home Health Physical Therapy prior to being released. Also, the skilled facility will be responsible for providing the patient with their medications at time of release from the facility to include their pain medication, the muscle relaxants, and their blood thinner medication. If the patient is  still at the rehab facility at time of the two week follow up appointment, the skilled rehab facility will also need to assist the patient in arranging follow up appointment in our office and any transportation needs.  MAKE SURE YOU:  Understand  these instructions.  Get help right away if you are not doing well or get worse.    Pick up stool softner and laxative for home use following surgery while on pain medications. Do not submerge incision under water. Please use good hand washing techniques while changing dressing each day. May shower starting three days after surgery. Please use a clean towel to pat the incision dry following showers. Continue to use ice for pain and swelling after surgery. Do not use any lotions or creams on the incision until instructed by your surgeon.   Do not sit on low chairs, stoools or toilet seats, as it may be difficult to get up from low surfaces   Complete by:  As directed    Driving restrictions   Complete by:  As directed    No driving for two weeks   TED hose   Complete by:  As directed    Use stockings (TED hose) for three weeks on both leg(s).  You may remove them at night for sleeping.   Weight bearing as tolerated   Complete by:  As directed      Allergies as of 12/21/2018      Reactions   Hylan G-f 20 Other (See Comments)   synvisc injections, made her have body aches like the 'flu'      Medication List    TAKE these medications   acetaminophen 500 MG tablet Commonly known as:  TYLENOL Take 1,000 mg by mouth every 6 (six) hours as needed (pain).   estradiol 0.1 MG/GM vaginal cream Commonly known as:  ESTRACE Place 1 Applicatorful vaginally 2 (two) times a week.   HYDROcodone-acetaminophen 5-325 MG tablet Commonly known as:  NORCO/VICODIN Take 1-2 tablets by mouth every 6 (six) hours as needed for severe pain.   methocarbamol 500 MG tablet Commonly known as:  ROBAXIN Take 1 tablet (500 mg total) by mouth every 6 (six) hours as  needed for muscle spasms.   metoprolol succinate 25 MG 24 hr tablet Commonly known as:  TOPROL-XL Take 25 mg by mouth daily.   Relpax 40 MG tablet Generic drug:  eletriptan Take 40 mg by mouth every 2 (two) hours as needed for migraine.   rivaroxaban 10 MG Tabs tablet Commonly known as:  XARELTO Take 1 tablet (10 mg total) by mouth daily with breakfast for 20 days. Then take one 81 mg aspirin once a day for three weeks.   traMADol 50 MG tablet Commonly known as:  ULTRAM Take 1-2 tablets (50-100 mg total) by mouth every 6 (six) hours as needed for moderate pain.   traZODone 100 MG tablet Commonly known as:  DESYREL Take 100 mg by mouth at bedtime.   zolpidem 10 MG tablet Commonly known as:  AMBIEN Take 5 mg by mouth at bedtime as needed.            Discharge Care Instructions  (From admission, onward)         Start     Ordered   12/21/18 0000  Weight bearing as tolerated     12/21/18 0756   12/21/18 0000  Change dressing    Comments:  You may change your dressing on Friday, then change the dressing daily with sterile 4 x 4 inch gauze dressing and paper tape.   12/21/18 0756         Follow-up Information    Ollen Gross, MD. Schedule an appointment as soon as possible for a visit on 01/02/2019.  Specialty:  Orthopedic Surgery Contact information: [address] [place] [address] [phone]           Signed: Arther Abbott, PA-C Orthopedic Surgery 12/25/2018, 7:28 AM

## 2019-01-17 DIAGNOSIS — M722 Plantar fascial fibromatosis: Secondary | ICD-10-CM | POA: Diagnosis not present

## 2019-01-19 DIAGNOSIS — M722 Plantar fascial fibromatosis: Secondary | ICD-10-CM | POA: Diagnosis not present

## 2019-01-22 DIAGNOSIS — M722 Plantar fascial fibromatosis: Secondary | ICD-10-CM | POA: Diagnosis not present

## 2019-01-23 ENCOUNTER — Ambulatory Visit
Admission: RE | Admit: 2019-01-23 | Discharge: 2019-01-23 | Disposition: A | Discharge status: Home / Self Care | Payer: Medicare Other | Source: Ambulatory Visit | Attending: Family Medicine | Admitting: Family Medicine

## 2019-01-23 ENCOUNTER — Other Ambulatory Visit: Payer: Self-pay

## 2019-01-23 DIAGNOSIS — Z1231 Encounter for screening mammogram for malignant neoplasm of breast: Secondary | ICD-10-CM

## 2019-01-23 DIAGNOSIS — Z96642 Presence of left artificial hip joint: Secondary | ICD-10-CM | POA: Diagnosis not present

## 2019-01-23 DIAGNOSIS — Z471 Aftercare following joint replacement surgery: Secondary | ICD-10-CM | POA: Diagnosis not present

## 2019-01-25 DIAGNOSIS — M722 Plantar fascial fibromatosis: Secondary | ICD-10-CM | POA: Diagnosis not present

## 2019-01-29 DIAGNOSIS — M722 Plantar fascial fibromatosis: Secondary | ICD-10-CM | POA: Diagnosis not present

## 2019-02-01 DIAGNOSIS — Z471 Aftercare following joint replacement surgery: Secondary | ICD-10-CM | POA: Diagnosis not present

## 2019-02-01 DIAGNOSIS — Z96652 Presence of left artificial knee joint: Secondary | ICD-10-CM | POA: Diagnosis not present

## 2019-02-01 DIAGNOSIS — M25562 Pain in left knee: Secondary | ICD-10-CM | POA: Diagnosis not present

## 2019-02-01 DIAGNOSIS — Z96642 Presence of left artificial hip joint: Secondary | ICD-10-CM | POA: Diagnosis not present

## 2019-02-02 DIAGNOSIS — Z96652 Presence of left artificial knee joint: Secondary | ICD-10-CM | POA: Diagnosis not present

## 2019-02-02 DIAGNOSIS — M722 Plantar fascial fibromatosis: Secondary | ICD-10-CM | POA: Diagnosis not present

## 2019-02-05 DIAGNOSIS — M722 Plantar fascial fibromatosis: Secondary | ICD-10-CM | POA: Diagnosis not present

## 2019-02-08 DIAGNOSIS — M722 Plantar fascial fibromatosis: Secondary | ICD-10-CM | POA: Diagnosis not present

## 2019-02-20 DIAGNOSIS — M25562 Pain in left knee: Secondary | ICD-10-CM | POA: Diagnosis not present

## 2019-02-21 ENCOUNTER — Other Ambulatory Visit: Payer: Self-pay | Admitting: Orthopedic Surgery

## 2019-02-21 DIAGNOSIS — M25562 Pain in left knee: Secondary | ICD-10-CM

## 2019-02-23 DIAGNOSIS — M25562 Pain in left knee: Secondary | ICD-10-CM | POA: Diagnosis not present

## 2019-02-26 ENCOUNTER — Encounter (HOSPITAL_COMMUNITY)
Admission: RE | Admit: 2019-02-26 | Discharge: 2019-02-26 | Disposition: A | Discharge status: Home / Self Care | Payer: Medicare Other | Source: Ambulatory Visit | Attending: Orthopedic Surgery | Admitting: Orthopedic Surgery

## 2019-02-26 ENCOUNTER — Other Ambulatory Visit: Payer: Self-pay

## 2019-02-26 DIAGNOSIS — M25562 Pain in left knee: Secondary | ICD-10-CM | POA: Insufficient documentation

## 2019-02-26 DIAGNOSIS — Z96653 Presence of artificial knee joint, bilateral: Secondary | ICD-10-CM | POA: Diagnosis not present

## 2019-02-26 MED ORDER — TECHNETIUM TC 99M MEDRONATE IV KIT
20.0000 | PACK | Freq: Once | INTRAVENOUS | Status: AC | PRN
  Administered 2019-02-26: 20 via INTRAVENOUS

## 2019-03-06 DIAGNOSIS — Z96652 Presence of left artificial knee joint: Secondary | ICD-10-CM | POA: Diagnosis not present

## 2019-03-06 DIAGNOSIS — M25562 Pain in left knee: Secondary | ICD-10-CM | POA: Diagnosis not present

## 2019-03-15 DIAGNOSIS — H524 Presbyopia: Secondary | ICD-10-CM | POA: Diagnosis not present

## 2019-03-15 DIAGNOSIS — H04123 Dry eye syndrome of bilateral lacrimal glands: Secondary | ICD-10-CM | POA: Diagnosis not present

## 2019-03-15 DIAGNOSIS — H40013 Open angle with borderline findings, low risk, bilateral: Secondary | ICD-10-CM | POA: Diagnosis not present

## 2019-03-15 DIAGNOSIS — H2513 Age-related nuclear cataract, bilateral: Secondary | ICD-10-CM | POA: Diagnosis not present

## 2019-03-15 DIAGNOSIS — H52223 Regular astigmatism, bilateral: Secondary | ICD-10-CM | POA: Diagnosis not present

## 2019-03-30 DIAGNOSIS — M19072 Primary osteoarthritis, left ankle and foot: Secondary | ICD-10-CM | POA: Diagnosis not present

## 2019-04-06 DIAGNOSIS — I129 Hypertensive chronic kidney disease with stage 1 through stage 4 chronic kidney disease, or unspecified chronic kidney disease: Secondary | ICD-10-CM | POA: Diagnosis not present

## 2019-04-06 DIAGNOSIS — Z0000 Encounter for general adult medical examination without abnormal findings: Secondary | ICD-10-CM | POA: Diagnosis not present

## 2019-04-06 DIAGNOSIS — G43909 Migraine, unspecified, not intractable, without status migrainosus: Secondary | ICD-10-CM | POA: Diagnosis not present

## 2019-04-06 DIAGNOSIS — J309 Allergic rhinitis, unspecified: Secondary | ICD-10-CM | POA: Diagnosis not present

## 2019-04-06 DIAGNOSIS — N952 Postmenopausal atrophic vaginitis: Secondary | ICD-10-CM | POA: Diagnosis not present

## 2019-04-06 DIAGNOSIS — F325 Major depressive disorder, single episode, in full remission: Secondary | ICD-10-CM | POA: Diagnosis not present

## 2019-04-06 DIAGNOSIS — N183 Chronic kidney disease, stage 3 (moderate): Secondary | ICD-10-CM | POA: Diagnosis not present

## 2019-04-06 DIAGNOSIS — E785 Hyperlipidemia, unspecified: Secondary | ICD-10-CM | POA: Diagnosis not present

## 2019-04-06 DIAGNOSIS — G4700 Insomnia, unspecified: Secondary | ICD-10-CM | POA: Diagnosis not present

## 2019-04-06 DIAGNOSIS — F419 Anxiety disorder, unspecified: Secondary | ICD-10-CM | POA: Diagnosis not present

## 2019-04-09 DIAGNOSIS — N183 Chronic kidney disease, stage 3 (moderate): Secondary | ICD-10-CM | POA: Diagnosis not present

## 2019-04-09 DIAGNOSIS — I129 Hypertensive chronic kidney disease with stage 1 through stage 4 chronic kidney disease, or unspecified chronic kidney disease: Secondary | ICD-10-CM | POA: Diagnosis not present

## 2019-04-09 DIAGNOSIS — Z23 Encounter for immunization: Secondary | ICD-10-CM | POA: Diagnosis not present

## 2019-06-28 DIAGNOSIS — Z1389 Encounter for screening for other disorder: Secondary | ICD-10-CM | POA: Diagnosis not present

## 2019-06-28 DIAGNOSIS — G43709 Chronic migraine without aura, not intractable, without status migrainosus: Secondary | ICD-10-CM | POA: Diagnosis not present

## 2019-06-28 DIAGNOSIS — M159 Polyosteoarthritis, unspecified: Secondary | ICD-10-CM | POA: Diagnosis not present

## 2019-06-28 DIAGNOSIS — F5104 Psychophysiologic insomnia: Secondary | ICD-10-CM | POA: Diagnosis not present

## 2019-06-28 DIAGNOSIS — I10 Essential (primary) hypertension: Secondary | ICD-10-CM | POA: Diagnosis not present

## 2019-06-28 DIAGNOSIS — M5030 Other cervical disc degeneration, unspecified cervical region: Secondary | ICD-10-CM | POA: Diagnosis not present

## 2019-07-10 DIAGNOSIS — M25462 Effusion, left knee: Secondary | ICD-10-CM | POA: Diagnosis not present

## 2019-07-10 DIAGNOSIS — Z96652 Presence of left artificial knee joint: Secondary | ICD-10-CM | POA: Diagnosis not present

## 2019-07-10 DIAGNOSIS — Z96642 Presence of left artificial hip joint: Secondary | ICD-10-CM | POA: Diagnosis not present

## 2019-07-10 DIAGNOSIS — M25562 Pain in left knee: Secondary | ICD-10-CM | POA: Diagnosis not present

## 2019-07-12 DIAGNOSIS — M25562 Pain in left knee: Secondary | ICD-10-CM | POA: Diagnosis not present

## 2019-07-12 DIAGNOSIS — Z96642 Presence of left artificial hip joint: Secondary | ICD-10-CM | POA: Diagnosis not present

## 2019-07-12 DIAGNOSIS — M25662 Stiffness of left knee, not elsewhere classified: Secondary | ICD-10-CM | POA: Diagnosis not present

## 2019-07-12 DIAGNOSIS — Z96652 Presence of left artificial knee joint: Secondary | ICD-10-CM | POA: Diagnosis not present

## 2019-07-12 DIAGNOSIS — M25462 Effusion, left knee: Secondary | ICD-10-CM | POA: Diagnosis not present

## 2019-07-16 DIAGNOSIS — Z96652 Presence of left artificial knee joint: Secondary | ICD-10-CM | POA: Diagnosis not present

## 2019-07-16 DIAGNOSIS — Z96642 Presence of left artificial hip joint: Secondary | ICD-10-CM | POA: Diagnosis not present

## 2019-07-16 DIAGNOSIS — M25562 Pain in left knee: Secondary | ICD-10-CM | POA: Diagnosis not present

## 2019-07-16 DIAGNOSIS — M25462 Effusion, left knee: Secondary | ICD-10-CM | POA: Diagnosis not present

## 2019-07-16 DIAGNOSIS — M25662 Stiffness of left knee, not elsewhere classified: Secondary | ICD-10-CM | POA: Diagnosis not present

## 2019-07-18 DIAGNOSIS — Z96652 Presence of left artificial knee joint: Secondary | ICD-10-CM | POA: Diagnosis not present

## 2019-07-18 DIAGNOSIS — M25562 Pain in left knee: Secondary | ICD-10-CM | POA: Diagnosis not present

## 2019-07-18 DIAGNOSIS — Z96642 Presence of left artificial hip joint: Secondary | ICD-10-CM | POA: Diagnosis not present

## 2019-07-18 DIAGNOSIS — M25662 Stiffness of left knee, not elsewhere classified: Secondary | ICD-10-CM | POA: Diagnosis not present

## 2019-07-18 DIAGNOSIS — M25462 Effusion, left knee: Secondary | ICD-10-CM | POA: Diagnosis not present

## 2019-07-20 DIAGNOSIS — Z96652 Presence of left artificial knee joint: Secondary | ICD-10-CM | POA: Diagnosis not present

## 2019-07-20 DIAGNOSIS — M25662 Stiffness of left knee, not elsewhere classified: Secondary | ICD-10-CM | POA: Diagnosis not present

## 2019-07-20 DIAGNOSIS — Z96642 Presence of left artificial hip joint: Secondary | ICD-10-CM | POA: Diagnosis not present

## 2019-07-20 DIAGNOSIS — M25462 Effusion, left knee: Secondary | ICD-10-CM | POA: Diagnosis not present

## 2019-07-20 DIAGNOSIS — M25562 Pain in left knee: Secondary | ICD-10-CM | POA: Diagnosis not present

## 2019-07-23 DIAGNOSIS — M25562 Pain in left knee: Secondary | ICD-10-CM | POA: Diagnosis not present

## 2019-07-23 DIAGNOSIS — Z96652 Presence of left artificial knee joint: Secondary | ICD-10-CM | POA: Diagnosis not present

## 2019-07-23 DIAGNOSIS — M25662 Stiffness of left knee, not elsewhere classified: Secondary | ICD-10-CM | POA: Diagnosis not present

## 2019-07-23 DIAGNOSIS — M25462 Effusion, left knee: Secondary | ICD-10-CM | POA: Diagnosis not present

## 2019-07-23 DIAGNOSIS — Z96642 Presence of left artificial hip joint: Secondary | ICD-10-CM | POA: Diagnosis not present

## 2019-07-25 DIAGNOSIS — M25562 Pain in left knee: Secondary | ICD-10-CM | POA: Diagnosis not present

## 2019-07-25 DIAGNOSIS — Z96652 Presence of left artificial knee joint: Secondary | ICD-10-CM | POA: Diagnosis not present

## 2019-07-25 DIAGNOSIS — M25462 Effusion, left knee: Secondary | ICD-10-CM | POA: Diagnosis not present

## 2019-07-25 DIAGNOSIS — M25662 Stiffness of left knee, not elsewhere classified: Secondary | ICD-10-CM | POA: Diagnosis not present

## 2019-07-25 DIAGNOSIS — Z96642 Presence of left artificial hip joint: Secondary | ICD-10-CM | POA: Diagnosis not present

## 2019-07-27 DIAGNOSIS — M25562 Pain in left knee: Secondary | ICD-10-CM | POA: Diagnosis not present

## 2019-07-27 DIAGNOSIS — M25662 Stiffness of left knee, not elsewhere classified: Secondary | ICD-10-CM | POA: Diagnosis not present

## 2019-07-27 DIAGNOSIS — M25462 Effusion, left knee: Secondary | ICD-10-CM | POA: Diagnosis not present

## 2019-07-27 DIAGNOSIS — Z96642 Presence of left artificial hip joint: Secondary | ICD-10-CM | POA: Diagnosis not present

## 2019-07-27 DIAGNOSIS — Z96652 Presence of left artificial knee joint: Secondary | ICD-10-CM | POA: Diagnosis not present

## 2019-07-30 DIAGNOSIS — M25662 Stiffness of left knee, not elsewhere classified: Secondary | ICD-10-CM | POA: Diagnosis not present

## 2019-07-30 DIAGNOSIS — Z96652 Presence of left artificial knee joint: Secondary | ICD-10-CM | POA: Diagnosis not present

## 2019-07-30 DIAGNOSIS — Z96642 Presence of left artificial hip joint: Secondary | ICD-10-CM | POA: Diagnosis not present

## 2019-07-30 DIAGNOSIS — M25562 Pain in left knee: Secondary | ICD-10-CM | POA: Diagnosis not present

## 2019-07-30 DIAGNOSIS — M25462 Effusion, left knee: Secondary | ICD-10-CM | POA: Diagnosis not present

## 2019-08-01 DIAGNOSIS — M25662 Stiffness of left knee, not elsewhere classified: Secondary | ICD-10-CM | POA: Diagnosis not present

## 2019-08-01 DIAGNOSIS — M25562 Pain in left knee: Secondary | ICD-10-CM | POA: Diagnosis not present

## 2019-08-01 DIAGNOSIS — Z96642 Presence of left artificial hip joint: Secondary | ICD-10-CM | POA: Diagnosis not present

## 2019-08-01 DIAGNOSIS — M25462 Effusion, left knee: Secondary | ICD-10-CM | POA: Diagnosis not present

## 2019-08-01 DIAGNOSIS — Z96652 Presence of left artificial knee joint: Secondary | ICD-10-CM | POA: Diagnosis not present

## 2019-08-04 DIAGNOSIS — Z96652 Presence of left artificial knee joint: Secondary | ICD-10-CM | POA: Diagnosis not present

## 2019-08-04 DIAGNOSIS — M25662 Stiffness of left knee, not elsewhere classified: Secondary | ICD-10-CM | POA: Diagnosis not present

## 2019-08-04 DIAGNOSIS — Z96642 Presence of left artificial hip joint: Secondary | ICD-10-CM | POA: Diagnosis not present

## 2019-08-04 DIAGNOSIS — M25562 Pain in left knee: Secondary | ICD-10-CM | POA: Diagnosis not present

## 2019-08-04 DIAGNOSIS — M25462 Effusion, left knee: Secondary | ICD-10-CM | POA: Diagnosis not present

## 2019-08-06 DIAGNOSIS — M25662 Stiffness of left knee, not elsewhere classified: Secondary | ICD-10-CM | POA: Diagnosis not present

## 2019-08-06 DIAGNOSIS — Z96652 Presence of left artificial knee joint: Secondary | ICD-10-CM | POA: Diagnosis not present

## 2019-08-06 DIAGNOSIS — M25462 Effusion, left knee: Secondary | ICD-10-CM | POA: Diagnosis not present

## 2019-08-06 DIAGNOSIS — M25562 Pain in left knee: Secondary | ICD-10-CM | POA: Diagnosis not present

## 2019-08-06 DIAGNOSIS — Z96642 Presence of left artificial hip joint: Secondary | ICD-10-CM | POA: Diagnosis not present

## 2019-08-09 DIAGNOSIS — M25462 Effusion, left knee: Secondary | ICD-10-CM | POA: Diagnosis not present

## 2019-08-09 DIAGNOSIS — M25562 Pain in left knee: Secondary | ICD-10-CM | POA: Diagnosis not present

## 2019-08-09 DIAGNOSIS — Z96642 Presence of left artificial hip joint: Secondary | ICD-10-CM | POA: Diagnosis not present

## 2019-08-09 DIAGNOSIS — M25662 Stiffness of left knee, not elsewhere classified: Secondary | ICD-10-CM | POA: Diagnosis not present

## 2019-08-09 DIAGNOSIS — Z96652 Presence of left artificial knee joint: Secondary | ICD-10-CM | POA: Diagnosis not present

## 2019-08-13 DIAGNOSIS — Z96642 Presence of left artificial hip joint: Secondary | ICD-10-CM | POA: Diagnosis not present

## 2019-08-13 DIAGNOSIS — Z96652 Presence of left artificial knee joint: Secondary | ICD-10-CM | POA: Diagnosis not present

## 2019-08-13 DIAGNOSIS — M25462 Effusion, left knee: Secondary | ICD-10-CM | POA: Diagnosis not present

## 2019-08-13 DIAGNOSIS — M25662 Stiffness of left knee, not elsewhere classified: Secondary | ICD-10-CM | POA: Diagnosis not present

## 2019-08-13 DIAGNOSIS — M25562 Pain in left knee: Secondary | ICD-10-CM | POA: Diagnosis not present

## 2019-08-15 DIAGNOSIS — M25562 Pain in left knee: Secondary | ICD-10-CM | POA: Diagnosis not present

## 2019-08-15 DIAGNOSIS — M25462 Effusion, left knee: Secondary | ICD-10-CM | POA: Diagnosis not present

## 2019-08-15 DIAGNOSIS — Z96652 Presence of left artificial knee joint: Secondary | ICD-10-CM | POA: Diagnosis not present

## 2019-08-15 DIAGNOSIS — Z96642 Presence of left artificial hip joint: Secondary | ICD-10-CM | POA: Diagnosis not present

## 2019-08-15 DIAGNOSIS — M25662 Stiffness of left knee, not elsewhere classified: Secondary | ICD-10-CM | POA: Diagnosis not present

## 2019-08-17 DIAGNOSIS — Z96642 Presence of left artificial hip joint: Secondary | ICD-10-CM | POA: Diagnosis not present

## 2019-08-17 DIAGNOSIS — M25462 Effusion, left knee: Secondary | ICD-10-CM | POA: Diagnosis not present

## 2019-08-17 DIAGNOSIS — Z96652 Presence of left artificial knee joint: Secondary | ICD-10-CM | POA: Diagnosis not present

## 2019-08-17 DIAGNOSIS — M25562 Pain in left knee: Secondary | ICD-10-CM | POA: Diagnosis not present

## 2019-08-17 DIAGNOSIS — M25662 Stiffness of left knee, not elsewhere classified: Secondary | ICD-10-CM | POA: Diagnosis not present

## 2019-08-20 DIAGNOSIS — Z96642 Presence of left artificial hip joint: Secondary | ICD-10-CM | POA: Diagnosis not present

## 2019-08-20 DIAGNOSIS — M25462 Effusion, left knee: Secondary | ICD-10-CM | POA: Diagnosis not present

## 2019-08-20 DIAGNOSIS — M25662 Stiffness of left knee, not elsewhere classified: Secondary | ICD-10-CM | POA: Diagnosis not present

## 2019-08-20 DIAGNOSIS — Z96652 Presence of left artificial knee joint: Secondary | ICD-10-CM | POA: Diagnosis not present

## 2019-08-20 DIAGNOSIS — M25562 Pain in left knee: Secondary | ICD-10-CM | POA: Diagnosis not present

## 2019-08-22 DIAGNOSIS — M25662 Stiffness of left knee, not elsewhere classified: Secondary | ICD-10-CM | POA: Diagnosis not present

## 2019-08-22 DIAGNOSIS — M25562 Pain in left knee: Secondary | ICD-10-CM | POA: Diagnosis not present

## 2019-08-22 DIAGNOSIS — Z96642 Presence of left artificial hip joint: Secondary | ICD-10-CM | POA: Diagnosis not present

## 2019-08-22 DIAGNOSIS — M25462 Effusion, left knee: Secondary | ICD-10-CM | POA: Diagnosis not present

## 2019-08-22 DIAGNOSIS — Z96652 Presence of left artificial knee joint: Secondary | ICD-10-CM | POA: Diagnosis not present

## 2019-09-11 DIAGNOSIS — M79672 Pain in left foot: Secondary | ICD-10-CM | POA: Diagnosis not present

## 2019-09-11 DIAGNOSIS — M67472 Ganglion, left ankle and foot: Secondary | ICD-10-CM | POA: Diagnosis not present

## 2019-09-25 DIAGNOSIS — M67472 Ganglion, left ankle and foot: Secondary | ICD-10-CM | POA: Diagnosis not present

## 2019-10-02 DIAGNOSIS — M19072 Primary osteoarthritis, left ankle and foot: Secondary | ICD-10-CM | POA: Diagnosis not present

## 2019-10-02 DIAGNOSIS — M79672 Pain in left foot: Secondary | ICD-10-CM | POA: Diagnosis not present

## 2019-10-10 DIAGNOSIS — Z23 Encounter for immunization: Secondary | ICD-10-CM | POA: Diagnosis not present

## 2019-10-11 DIAGNOSIS — H2511 Age-related nuclear cataract, right eye: Secondary | ICD-10-CM | POA: Diagnosis not present

## 2019-10-11 DIAGNOSIS — H04123 Dry eye syndrome of bilateral lacrimal glands: Secondary | ICD-10-CM | POA: Diagnosis not present

## 2019-10-11 DIAGNOSIS — Z83511 Family history of glaucoma: Secondary | ICD-10-CM | POA: Diagnosis not present

## 2019-10-18 DIAGNOSIS — M545 Low back pain: Secondary | ICD-10-CM | POA: Diagnosis not present

## 2019-10-18 DIAGNOSIS — G43019 Migraine without aura, intractable, without status migrainosus: Secondary | ICD-10-CM | POA: Diagnosis not present

## 2019-10-18 DIAGNOSIS — G8929 Other chronic pain: Secondary | ICD-10-CM | POA: Diagnosis not present

## 2019-10-18 DIAGNOSIS — M47812 Spondylosis without myelopathy or radiculopathy, cervical region: Secondary | ICD-10-CM | POA: Diagnosis not present

## 2019-10-18 DIAGNOSIS — M5481 Occipital neuralgia: Secondary | ICD-10-CM | POA: Diagnosis not present

## 2019-10-18 DIAGNOSIS — M47816 Spondylosis without myelopathy or radiculopathy, lumbar region: Secondary | ICD-10-CM | POA: Diagnosis not present

## 2019-10-30 DIAGNOSIS — Z01818 Encounter for other preprocedural examination: Secondary | ICD-10-CM | POA: Diagnosis not present

## 2019-10-30 DIAGNOSIS — I10 Essential (primary) hypertension: Secondary | ICD-10-CM | POA: Diagnosis not present

## 2019-10-30 DIAGNOSIS — H269 Unspecified cataract: Secondary | ICD-10-CM | POA: Diagnosis not present

## 2019-10-30 DIAGNOSIS — Z1231 Encounter for screening mammogram for malignant neoplasm of breast: Secondary | ICD-10-CM | POA: Diagnosis not present

## 2019-11-07 DIAGNOSIS — H2511 Age-related nuclear cataract, right eye: Secondary | ICD-10-CM | POA: Diagnosis not present

## 2020-12-14 IMAGING — RF DG C-ARM 1-60 MIN-NO REPORT
1 series · 1 of 1 positions shown · non-contrast
Comparison: None.

CLINICAL DATA: 67-year-old female with left hip replacement

EXAM:
OPERATIVE LEFT HIP (WITH PELVIS IF PERFORMED)  VIEWS
TECHNIQUE: Fluoroscopic spot image(s) were submitted for interpretation
post-operatively.

[Series 1: unknown protocol · 0.20mm/px · 1 of 1 slices shown]
[im 1/1]
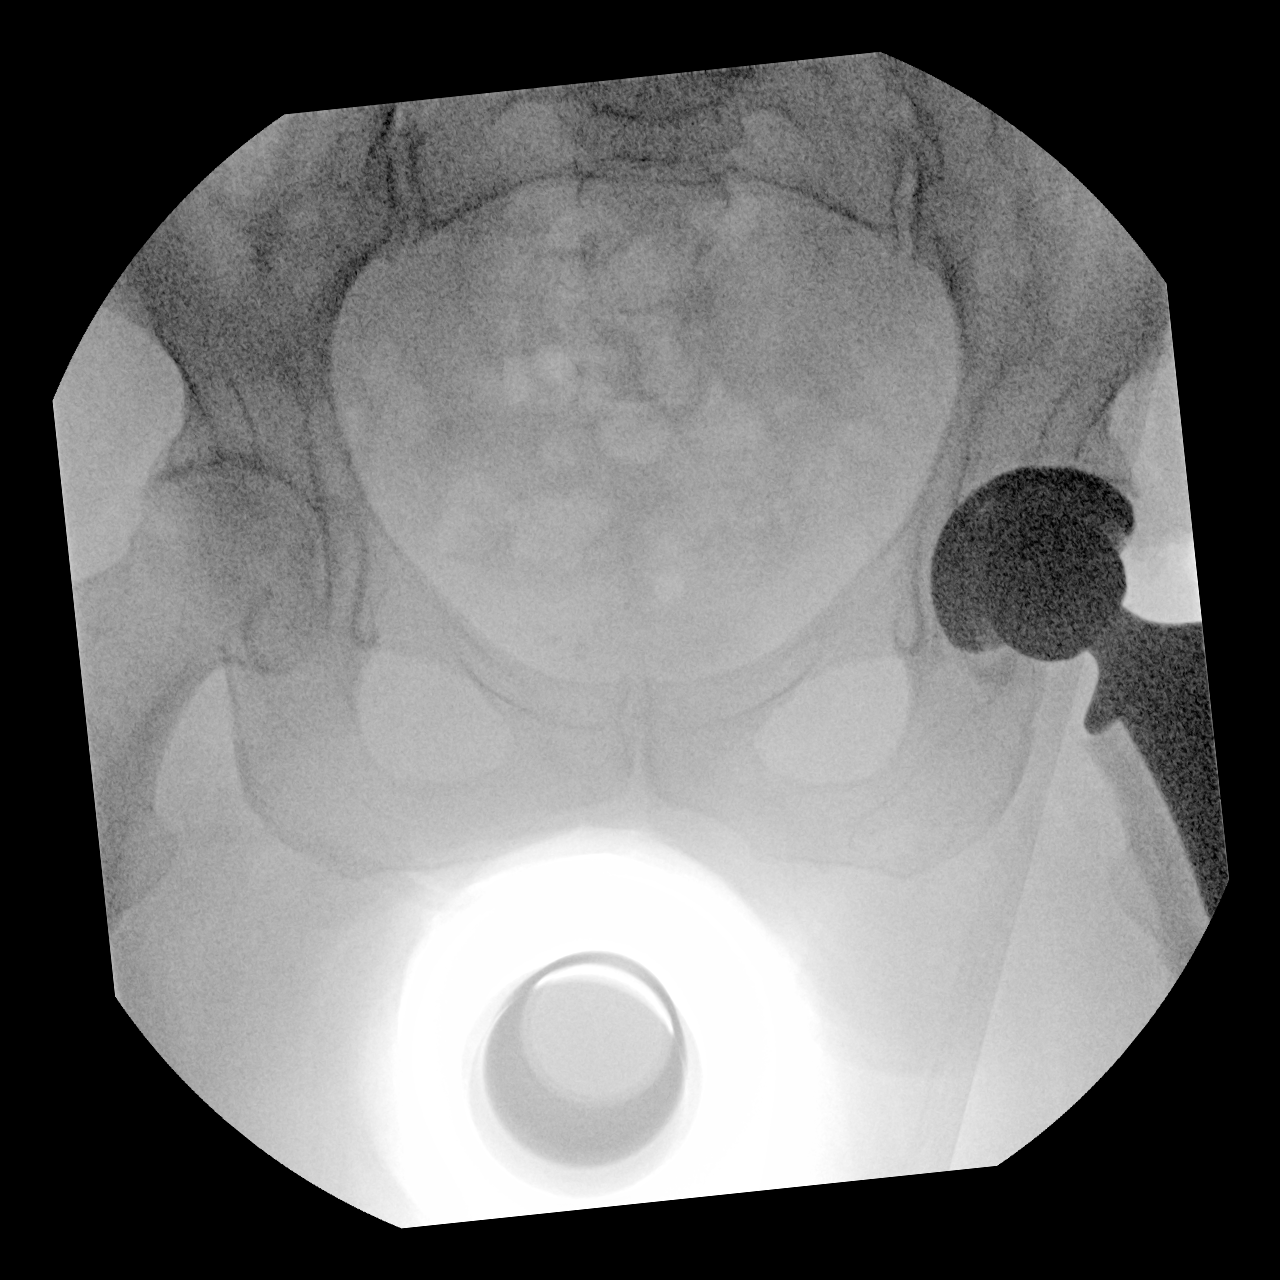

[1 of 1 positions shown; findings below may reference images not displayed]

FINDINGS: Single intraoperative fluoroscopic spot images of the pelvis.
Surgical changes of left hip arthroplasty.
IMPRESSION: Limited intraoperative fluoroscopic spot images demonstrating left
hip arthroplasty Please refer to the dictated operative report for
full details of intraoperative findings and procedure.

## 2021-01-17 IMAGING — MG DIGITAL SCREENING BILATERAL MAMMOGRAM WITH TOMO AND CAD
8 series · 8 of 24 positions shown · non-contrast
Comparison: Previous exam(s).

CLINICAL DATA: Screening.

EXAM:
DIGITAL SCREENING BILATERAL MAMMOGRAM WITH TOMO AND CAD

[L CC synth-2D]
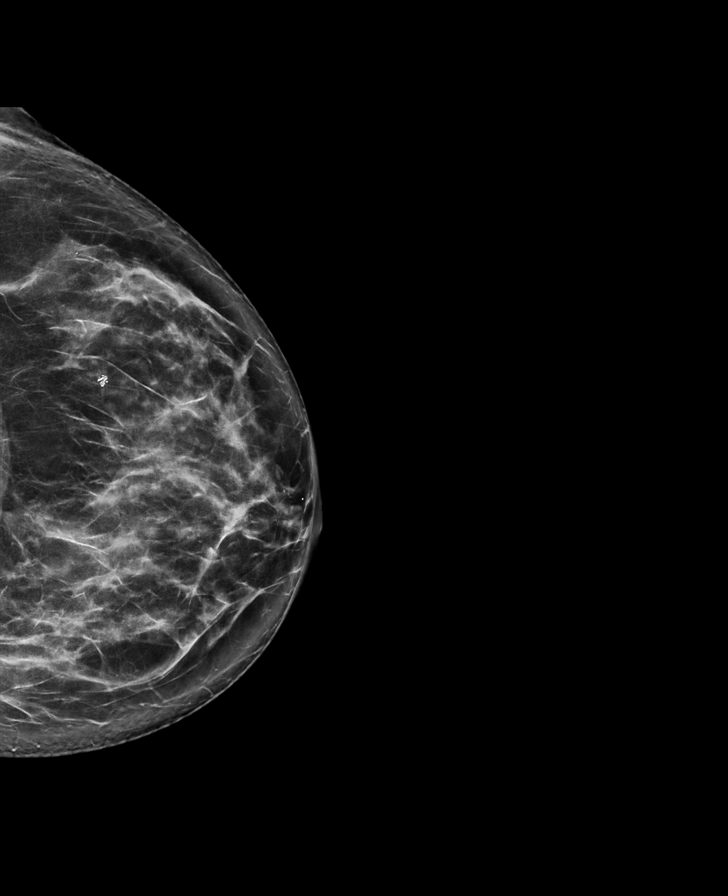

[R CC synth-2D]
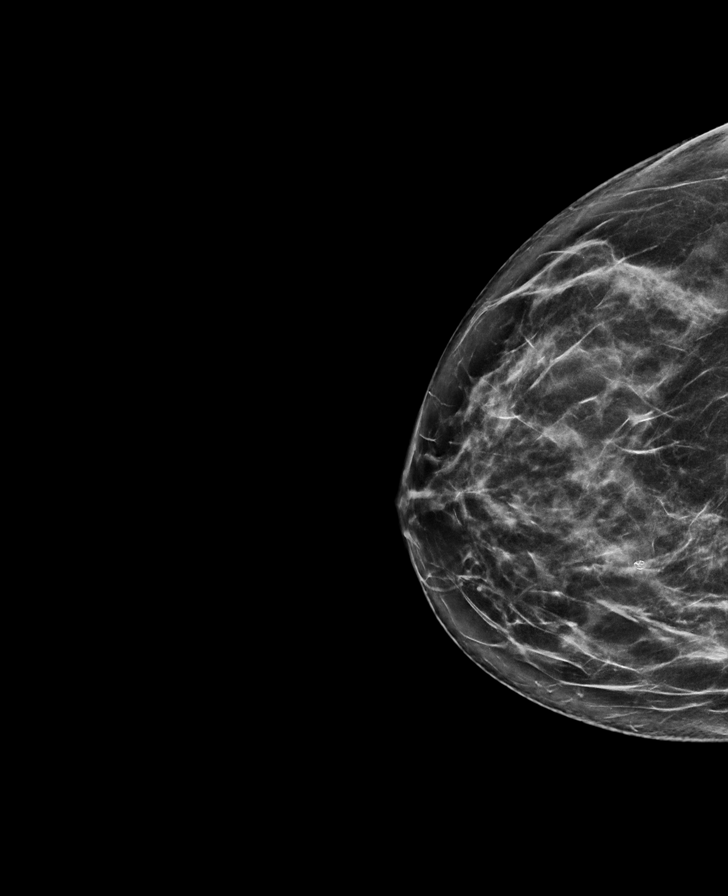

[L MLO synth-2D]
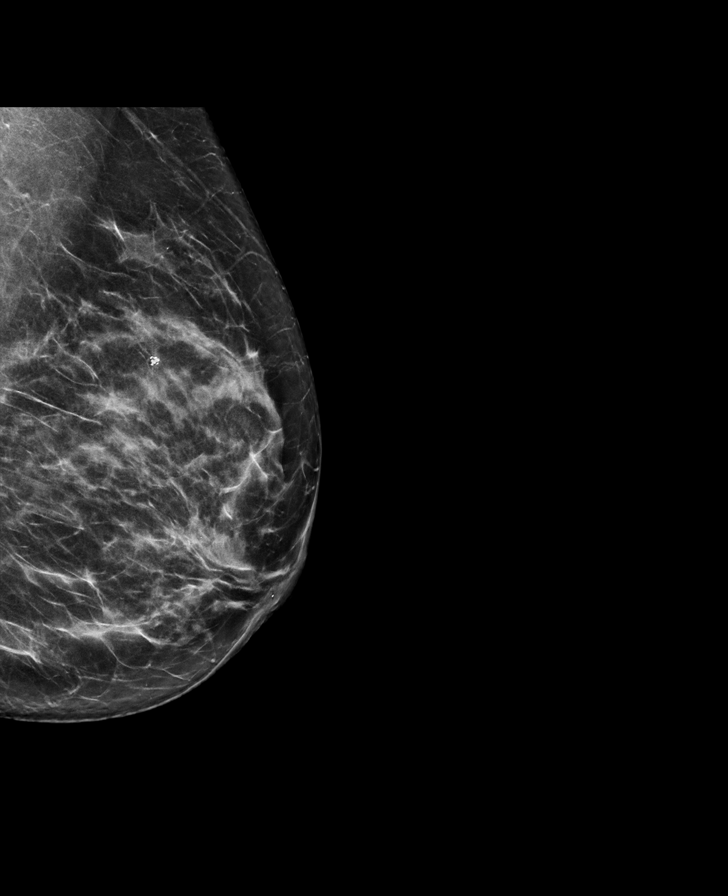

[R MLO synth-2D]
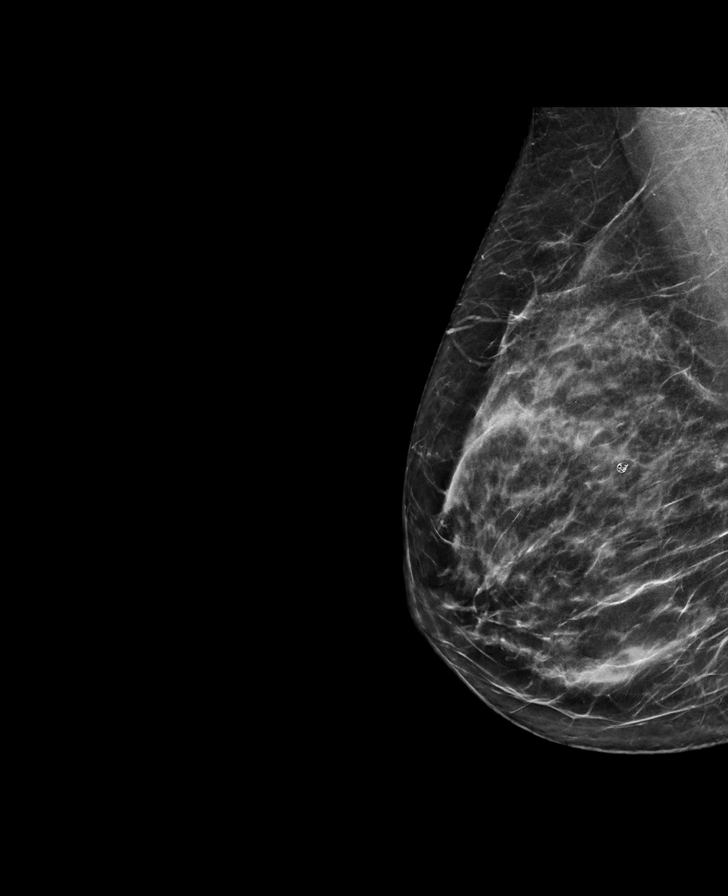

[L MLO tomo · tomo slice 36/71.0]
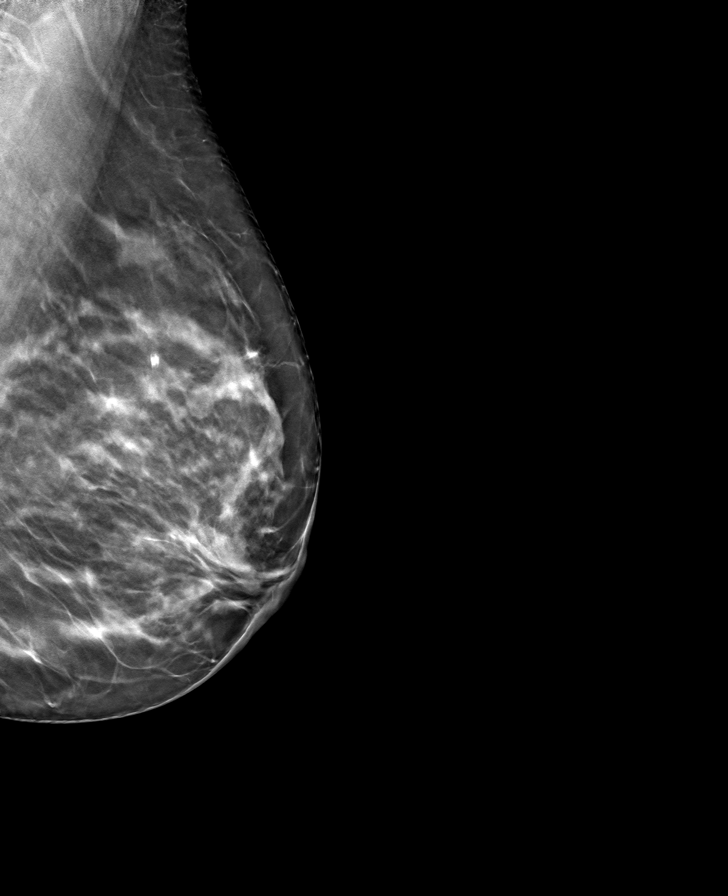

[L CC tomo · tomo slice 38/75.0]
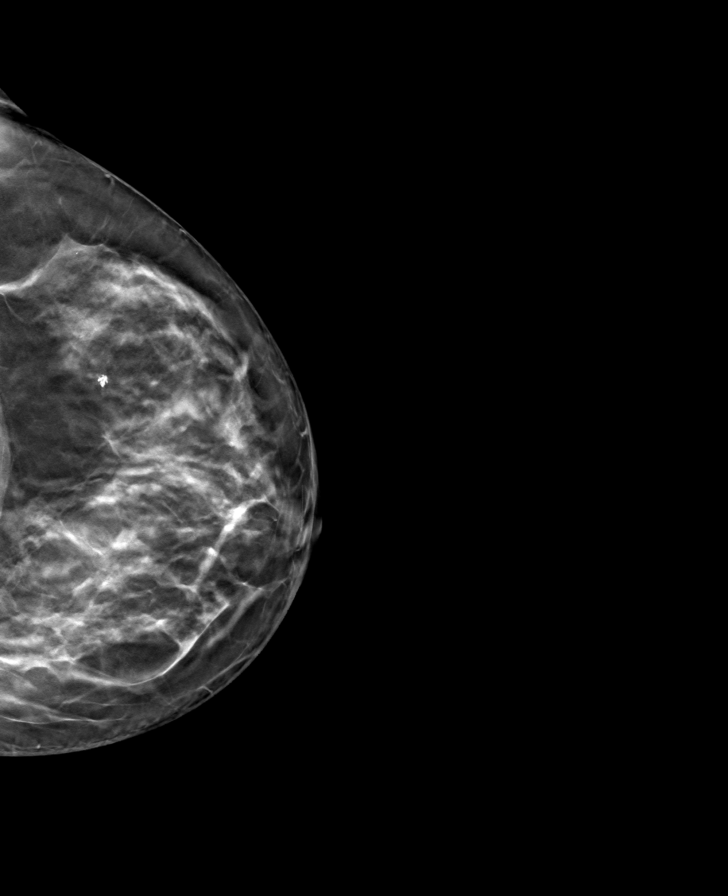

[R MLO tomo · tomo slice 36/71.0]
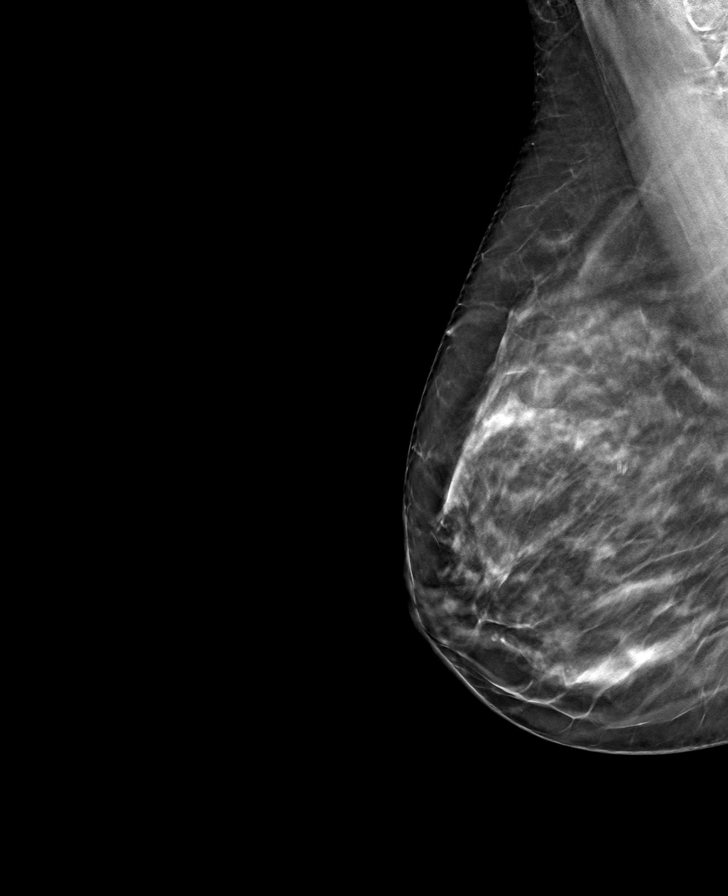

[R CC tomo · tomo slice 37/73.0]
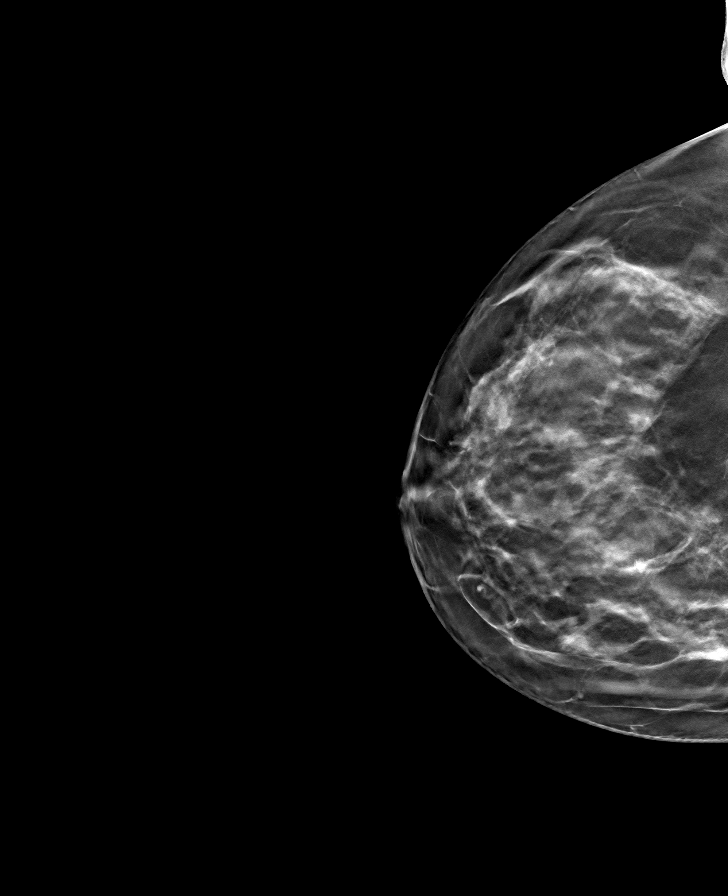

[8 of 24 positions shown; findings below may reference images not displayed]

ACR Breast Density Category c: The breast tissue is heterogeneously
dense, which may obscure small masses.
FINDINGS: There are no findings suspicious for malignancy. Images were
processed with CAD.
IMPRESSION: No mammographic evidence of malignancy. A result letter of this
screening mammogram will be mailed directly to the patient.

RECOMMENDATION:
Screening mammogram in one year. (Code:[id number])

BI-RADS CATEGORY  1: Negative.
